# Patient Record
Sex: Female | Born: 1955 | Race: White | Hispanic: No | Smoking: Current every day smoker
Health system: Southern US, Community
[De-identification: ages and names within clinical notes are randomized; demographics above are authoritative.]

## PROBLEM LIST (undated history)

## (undated) DIAGNOSIS — M1990 Unspecified osteoarthritis, unspecified site: Secondary | ICD-10-CM

## (undated) DIAGNOSIS — K219 Gastro-esophageal reflux disease without esophagitis: Secondary | ICD-10-CM

## (undated) DIAGNOSIS — I499 Cardiac arrhythmia, unspecified: Secondary | ICD-10-CM

## (undated) DIAGNOSIS — I209 Angina pectoris, unspecified: Secondary | ICD-10-CM

## (undated) DIAGNOSIS — G8929 Other chronic pain: Secondary | ICD-10-CM

## (undated) DIAGNOSIS — E7800 Pure hypercholesterolemia, unspecified: Secondary | ICD-10-CM

## (undated) DIAGNOSIS — F419 Anxiety disorder, unspecified: Secondary | ICD-10-CM

## (undated) DIAGNOSIS — J449 Chronic obstructive pulmonary disease, unspecified: Secondary | ICD-10-CM

## (undated) DIAGNOSIS — R0600 Dyspnea, unspecified: Secondary | ICD-10-CM

## (undated) DIAGNOSIS — Z8719 Personal history of other diseases of the digestive system: Secondary | ICD-10-CM

## (undated) DIAGNOSIS — R51 Headache: Secondary | ICD-10-CM

## (undated) DIAGNOSIS — I10 Essential (primary) hypertension: Secondary | ICD-10-CM

## (undated) DIAGNOSIS — R112 Nausea with vomiting, unspecified: Secondary | ICD-10-CM

## (undated) DIAGNOSIS — Z9889 Other specified postprocedural states: Secondary | ICD-10-CM

## (undated) DIAGNOSIS — J42 Unspecified chronic bronchitis: Secondary | ICD-10-CM

## (undated) DIAGNOSIS — M797 Fibromyalgia: Secondary | ICD-10-CM

## (undated) DIAGNOSIS — R519 Headache, unspecified: Secondary | ICD-10-CM

## (undated) DIAGNOSIS — M549 Dorsalgia, unspecified: Secondary | ICD-10-CM

## (undated) DIAGNOSIS — R0609 Other forms of dyspnea: Secondary | ICD-10-CM

## (undated) DIAGNOSIS — G43909 Migraine, unspecified, not intractable, without status migrainosus: Secondary | ICD-10-CM

## (undated) HISTORY — PX: BACK SURGERY: SHX140

## (undated) HISTORY — PX: CHOLECYSTECTOMY: SHX55

## (undated) HISTORY — PX: POSTERIOR FUSION LUMBAR SPINE: SUR632

## (undated) HISTORY — PX: CARPAL TUNNEL RELEASE: SHX101

---
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
## 1977-03-22 HISTORY — PX: TUBAL LIGATION: SHX77

## 1980-03-22 HISTORY — PX: ABDOMINAL HYSTERECTOMY: SHX81

## 1983-03-23 HISTORY — PX: SALPINGOOPHORECTOMY: SHX82

## 2001-03-22 HISTORY — PX: LUMBAR DISC SURGERY: SHX700

## 2002-10-22 ENCOUNTER — Observation Stay (HOSPITAL_COMMUNITY): Admission: RE | Admit: 2002-10-22 | Discharge: 2002-10-22 | Payer: Self-pay | Admitting: *Deleted

## 2002-10-22 ENCOUNTER — Encounter: Payer: Self-pay | Admitting: *Deleted

## 2004-01-16 ENCOUNTER — Encounter: Admission: RE | Admit: 2004-01-16 | Discharge: 2004-01-16 | Payer: Self-pay | Admitting: Neurosurgery

## 2004-01-24 ENCOUNTER — Encounter: Admission: RE | Admit: 2004-01-24 | Discharge: 2004-01-24 | Payer: Self-pay | Admitting: Neurosurgery

## 2004-02-07 ENCOUNTER — Encounter: Admission: RE | Admit: 2004-02-07 | Discharge: 2004-02-07 | Payer: Self-pay | Admitting: Neurosurgery

## 2004-03-06 ENCOUNTER — Inpatient Hospital Stay (HOSPITAL_COMMUNITY): Admission: RE | Admit: 2004-03-06 | Discharge: 2004-03-08 | Payer: Self-pay | Admitting: Neurosurgery

## 2004-03-22 HISTORY — PX: ANTERIOR CERVICAL DISCECTOMY: SHX1160

## 2004-04-20 ENCOUNTER — Encounter: Admission: RE | Admit: 2004-04-20 | Discharge: 2004-04-20 | Payer: Self-pay | Admitting: Neurosurgery

## 2004-05-27 ENCOUNTER — Encounter: Admission: RE | Admit: 2004-05-27 | Discharge: 2004-05-27 | Payer: Self-pay | Admitting: Neurosurgery

## 2005-07-09 IMAGING — CR DG LUMBAR SPINE 2-3V
3 series · 3 of 3 positions shown · non-contrast
Comparison: none

CLINICAL DATA: 49 year old with lumbar fusion.
 THREE VIEW LUMBAR SPINE SERIES:
 Three views of the lumbar spine are compared to prior post operative films which are dated 03/05/04. 

 There are pedicle screws and posterior rods and interbody bone plug at L3-4.  No complicating features.  Normal overall alignment.

[view not recorded (1 of 3)]
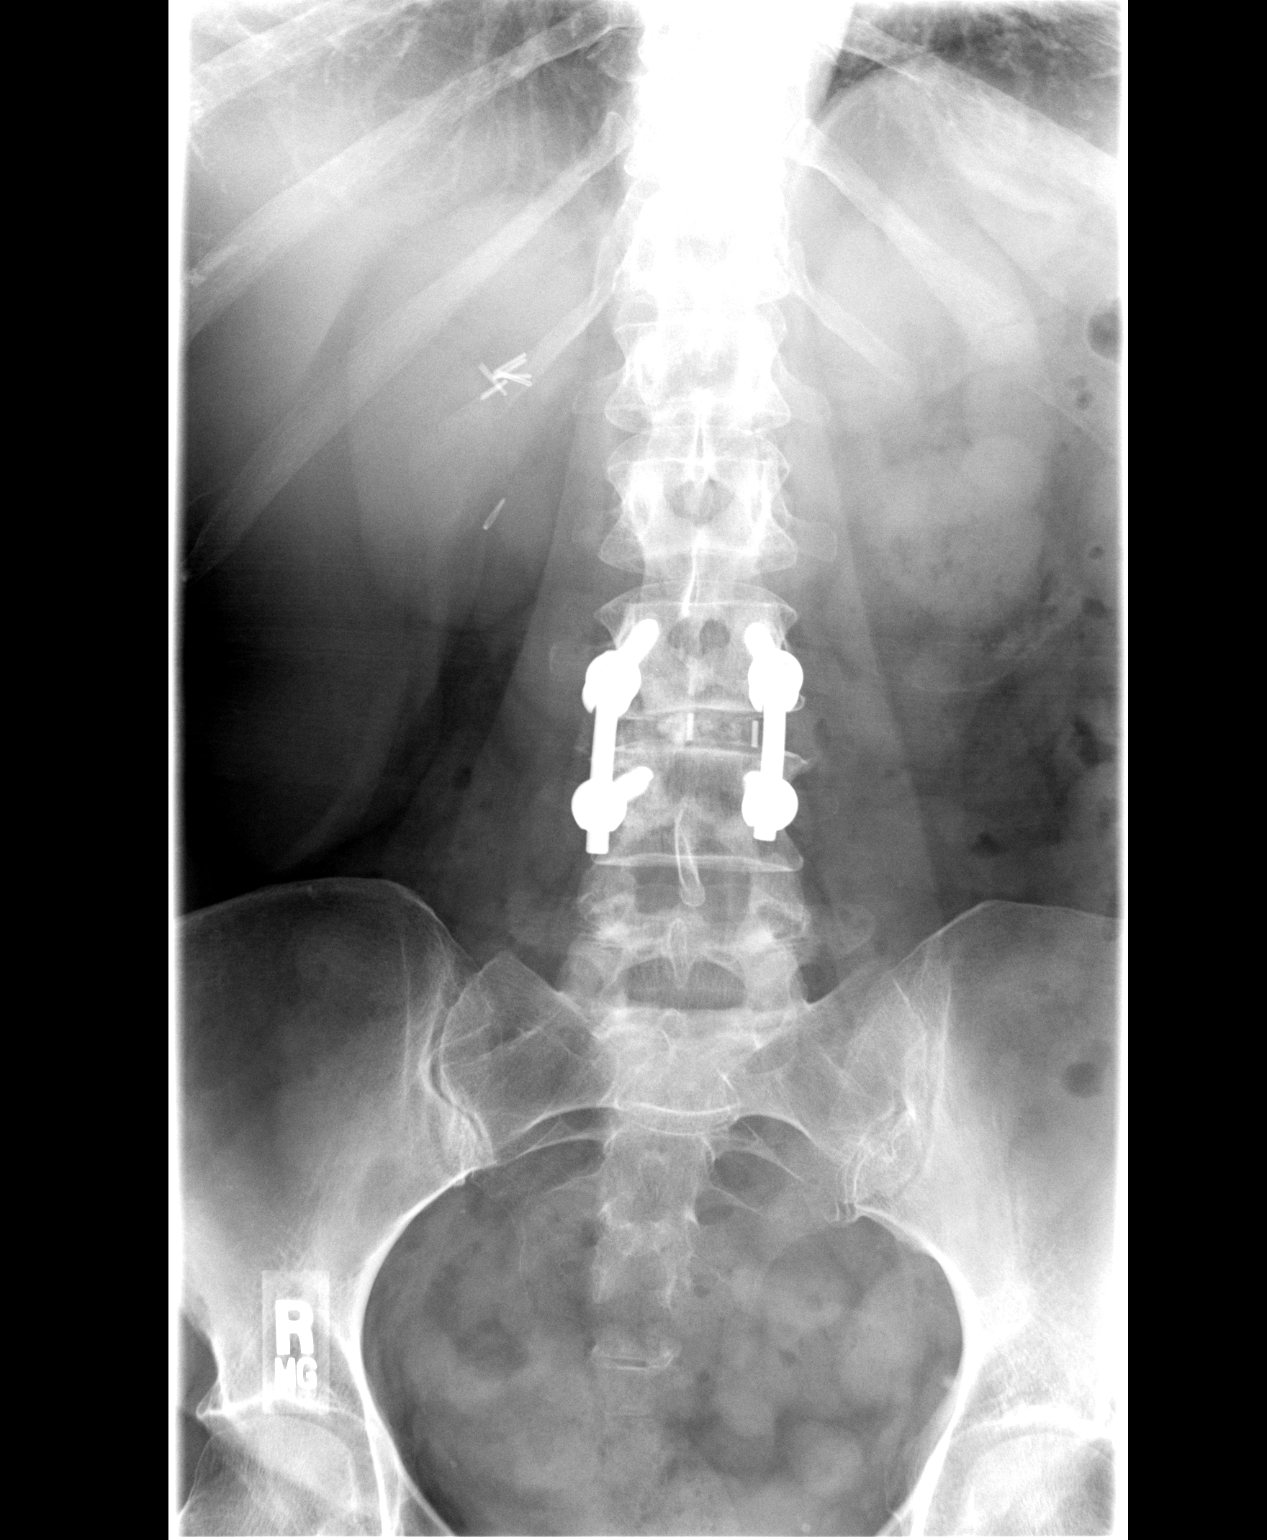

[view not recorded (2 of 3)]
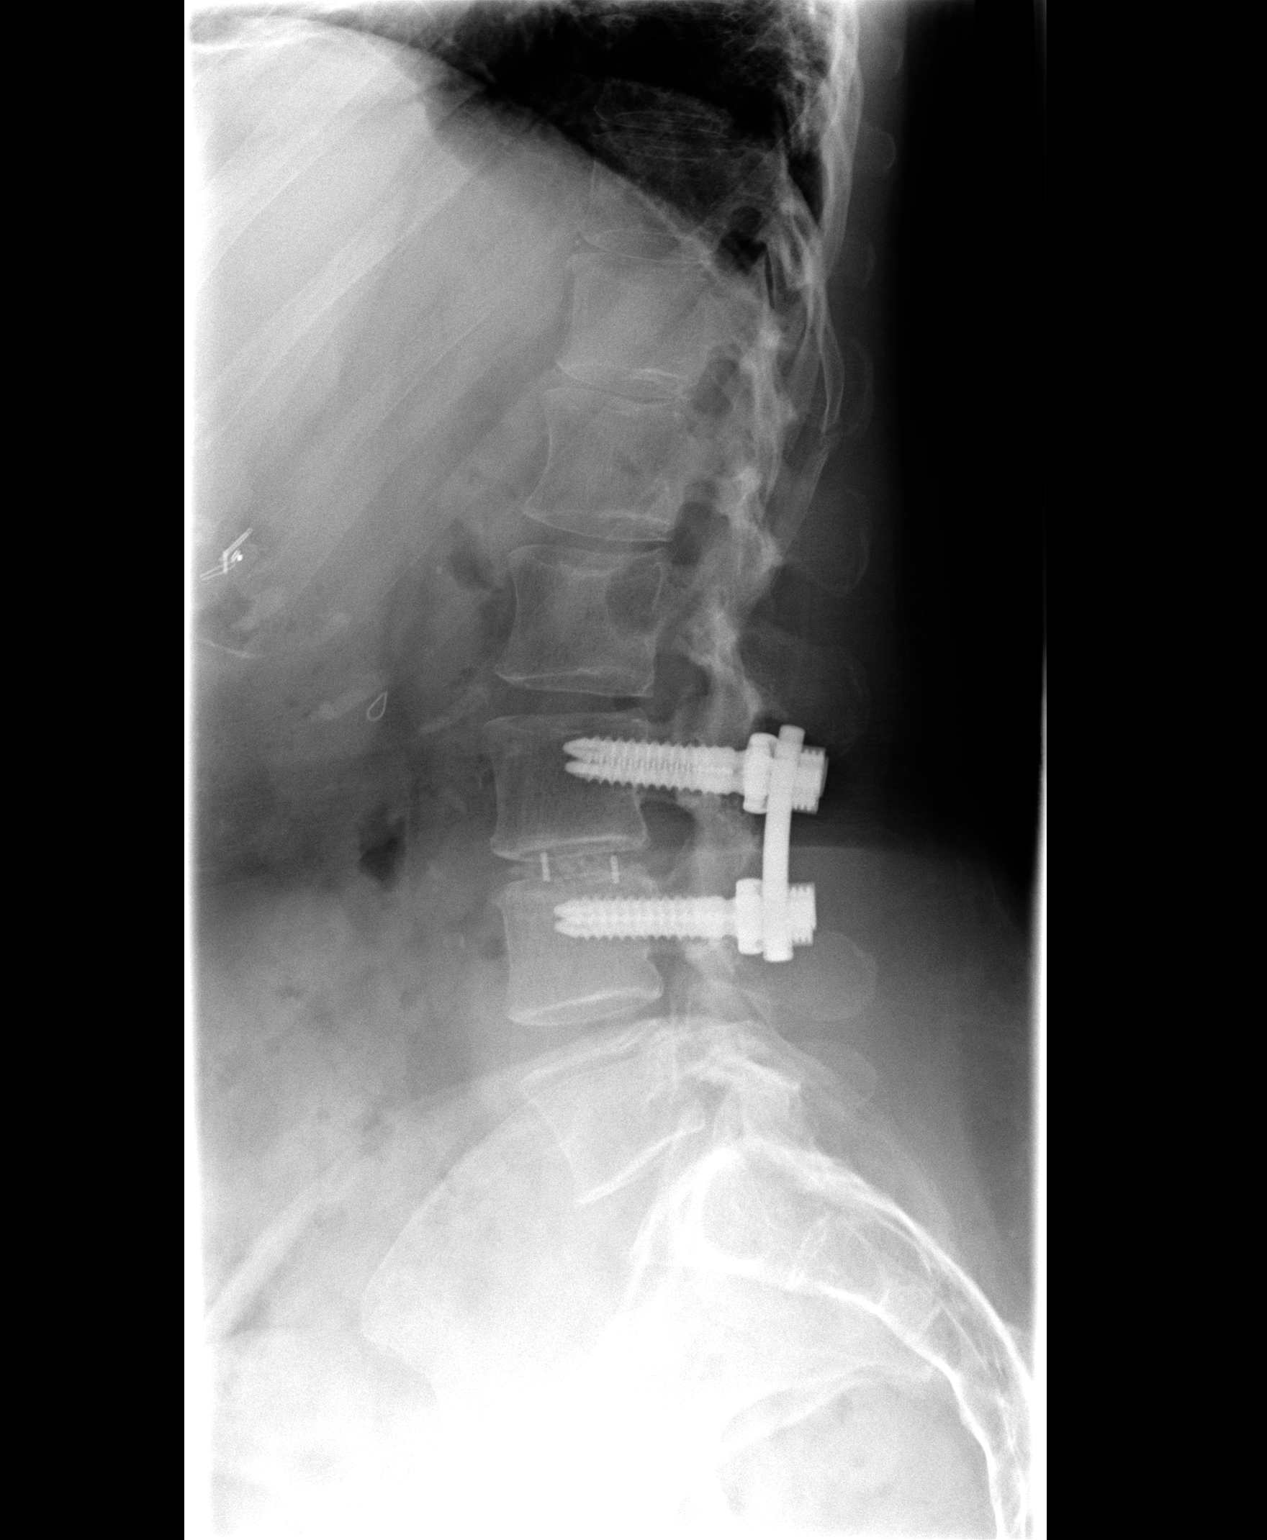

[view not recorded (3 of 3)]
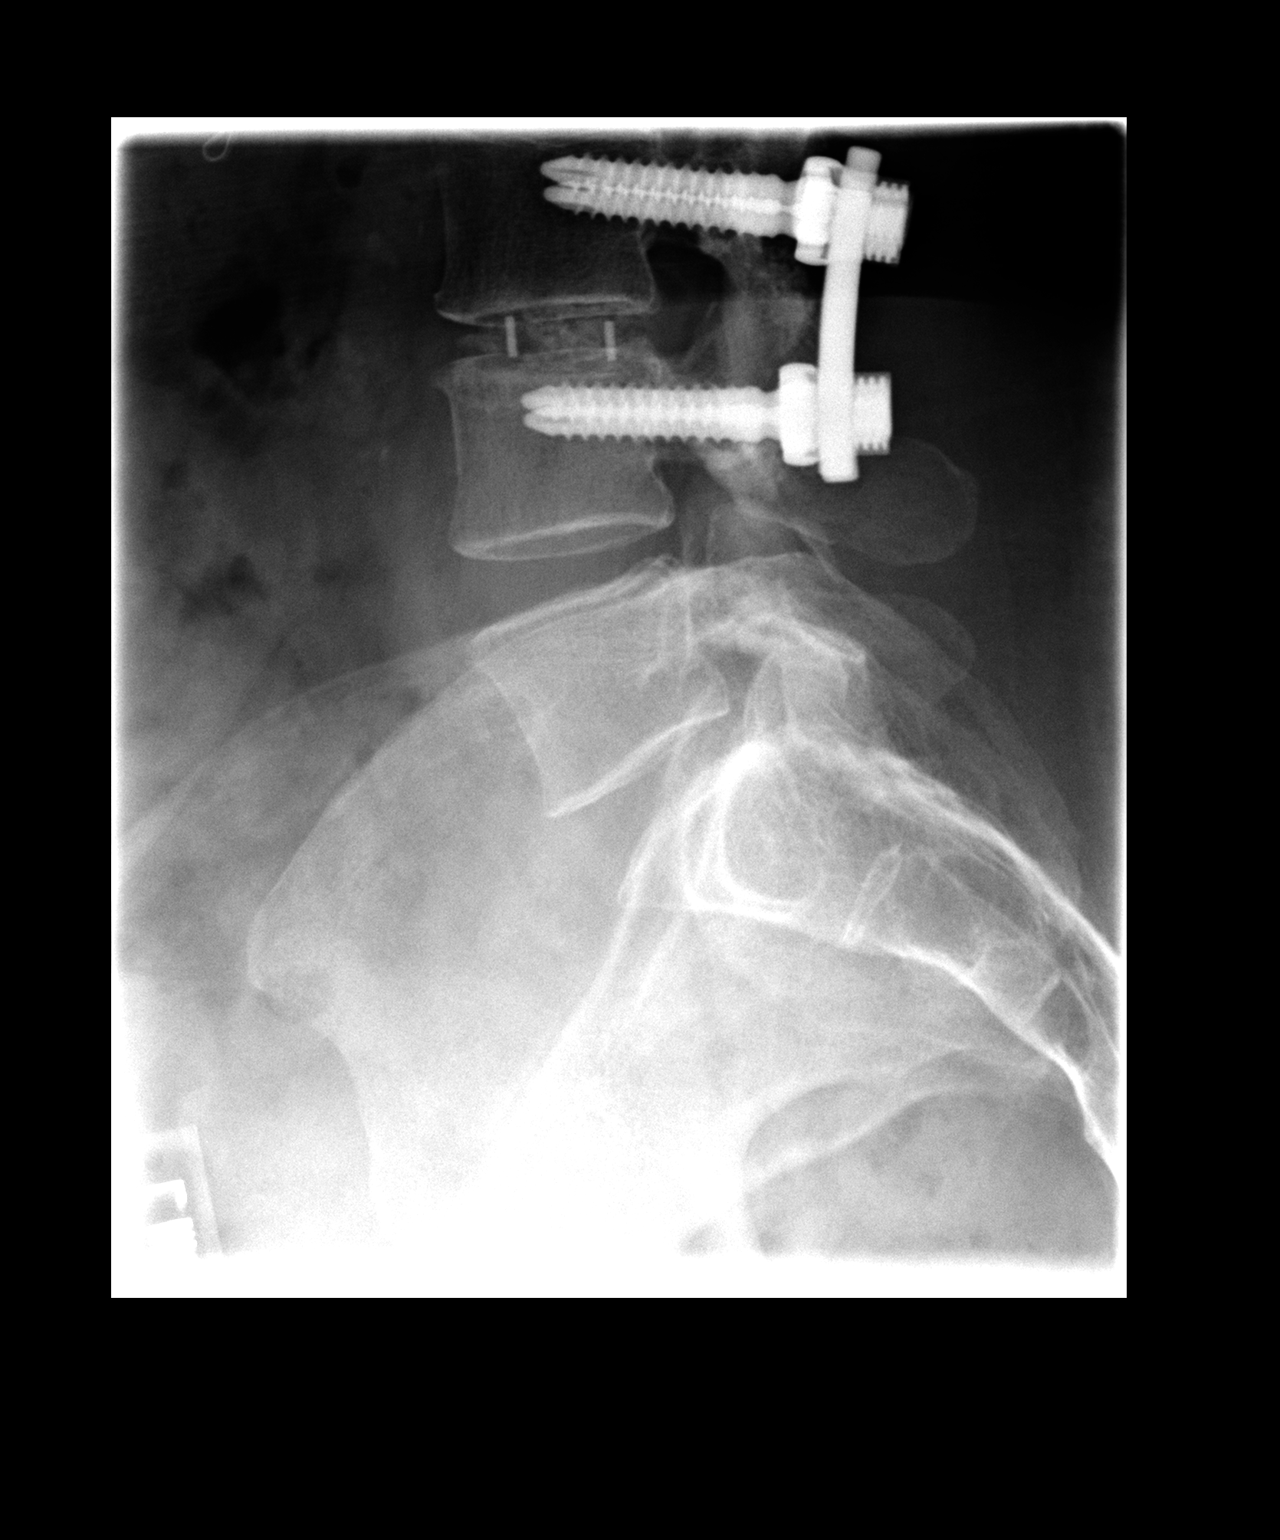

[3 of 3 positions shown; findings below may reference images not displayed]

IMPRESSION: 1.  L3-4 fusion without complicating features.
 2.  Healing or healed left eleventh rib fracture.

## 2007-04-21 ENCOUNTER — Inpatient Hospital Stay (HOSPITAL_COMMUNITY): Admission: RE | Admit: 2007-04-21 | Discharge: 2007-04-23 | Payer: Self-pay | Admitting: Neurosurgery

## 2009-11-11 ENCOUNTER — Encounter: Admission: RE | Admit: 2009-11-11 | Discharge: 2009-11-11 | Payer: Self-pay | Admitting: Neurosurgery

## 2009-12-16 ENCOUNTER — Inpatient Hospital Stay (HOSPITAL_COMMUNITY): Admission: RE | Admit: 2009-12-16 | Discharge: 2009-12-17 | Payer: Self-pay | Admitting: Neurosurgery

## 2010-01-26 ENCOUNTER — Encounter: Admission: RE | Admit: 2010-01-26 | Discharge: 2010-01-26 | Payer: Self-pay | Admitting: Neurosurgery

## 2010-03-09 ENCOUNTER — Encounter
Admission: RE | Admit: 2010-03-09 | Discharge: 2010-03-09 | Payer: Self-pay | Source: Home / Self Care | Attending: Neurosurgery | Admitting: Neurosurgery

## 2010-06-04 LAB — BASIC METABOLIC PANEL
BUN: 4 mg/dL — ABNORMAL LOW (ref 6–23)
CO2: 31 mEq/L (ref 19–32)
Calcium: 9.2 mg/dL (ref 8.4–10.5)
Chloride: 105 mEq/L (ref 96–112)
Creatinine, Ser: 0.73 mg/dL (ref 0.4–1.2)
GFR calc Af Amer: >60 mL/min (ref >60)
GFR calc non Af Amer: >60 mL/min (ref >60)
Glucose, Bld: 104 mg/dL — ABNORMAL HIGH (ref 70–99)
Potassium: 3.2 mEq/L — ABNORMAL LOW (ref 3.5–5.1)
Sodium: 144 mEq/L (ref 135–145)

## 2010-06-04 LAB — TYPE AND SCREEN
ABO/RH(D): A POS
Antibody Screen: NEGATIVE

## 2010-06-04 LAB — CBC
HCT: 39.7 % (ref 36.0–46.0)
MCV: 102.1 fL — ABNORMAL HIGH (ref 78.0–100.0)
RBC: 3.89 MIL/uL (ref 3.87–5.11)
RDW: 15 % (ref 11.5–15.5)
WBC: 6.1 10*3/uL (ref 4.0–10.5)

## 2010-06-04 LAB — SURGICAL PCR SCREEN: MRSA, PCR: NEGATIVE

## 2010-08-04 NOTE — Op Note (Signed)
NAMEVIRGIE, KUNDA               ACCOUNT NO.:  [id number]   MEDICAL RECORD NO.:  [id number]          PATIENT TYPE:  INP   LOCATION:  2864                         FACILITY:  [facility]   PHYSICIAN:  Reinaldo Meeker, M.D. 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   PROCEDURE:  Exploration of L3-4 fusion with removal of L3-4  instrumentation, followed by bilateral L4-5 decompressive laminectomy,  followed by L4-5 posterior lumbar interbody fusion with Nuvasive bony  spacer and PEEK interbody cage, followed by new pedicle screw  instrumentation and replacement of pedicle screw instrumentation L3-4/L4-  5, followed by L3-4 and L4-5 posterolateral fusion.   SURGEON:  Dr. Gerlene Fee.   ASSISTANT:  Dr. Marikay Alar.   PROCEDURE IN DETAIL:  After being placed in the prone position, the  patient's back was prepped and draped in usual sterile fashion.  Previous lumbar incision was opened and carried down a bit more inferior  until the spinous process of L3, L4, and L5 had been exposed.  The  instrumentation at L3-4 was identified bilaterally without difficulty,  and then subperiosteal section was then carried out on the spinous  processes and lamina of the facet joints of L4-5 and into the far  lateral region to identify the transverse processes of L3, L4 and L5.  The L4-5 segment was found to be extremely loose which was expected, but  the L3-4 level appeared to be solidly fused.  The top loading nuts at L3-  4 were removed and the rod removed bilaterally.  The spinous processes  of L4 and L5 were then removed, and the bone saved for use later in the  case.  Generous laminotomies were then performed bilaterally by  removing  the remainder of the L4 lamina, the medial three-quarters of the facet  joint, the superior half at the L5 lamina.  Residual bone was removed  and saved for use later in the case, and scar and ligamentum flavum  removed in a piecemeal fashion.  Similar decompression was then carried  on the right side, and the residual midline structures were removed to  complete the bilateral decompression.  At this time, bilateral  microdiskectomy was carried out at L4-5 without difficulty.  The disk  was then prepared for posterior interbody fusion.  This was distracted  up to a 10-mm size.  Rotating cutter followed by box chisel was used on  one side, and the 10-mm invasive bony spacer was placed.  On the  opposite side, a PEEK interbody cage was placed.  Prior to placing the  second spacer, autologous bone, BMP, and Actifuse fuse putty were placed  in the midline to help with the interbody fusion.  PEEK spacer was then  placed which was also filled with Actifuse and autologous bone.  Fluoroscopy showed these to be in good position.  Pedicle screws  were  then placed at L5 in standard fashion.  A drill hole was used for entry  followed by using the awl tapping with a 6-0 tap and placing a 6.5 x 14-  mm screws of InCompass bilaterally.  These were found in good position  with dry palpation of the pedicle under fluoroscopy.  At this time,  large amounts of irrigation were carried out.  Any bleeding controlled  with Bovie coagulation and Gelfoam.  Decortication was carried out in  the far lateral region, posterolateral fusion was carried out from L3-L5  with BMP, Actifuse, and autologous bone.  An appropriate length rod was  then chosen, and then the top loading nuts were secured sequentially  with sequential compression, particularly at L4-5.  Final fluoroscopy in  AP and lateral direction showed the interbody spacers, screws and rods  to all be in good position.  Any bleeding at this time was  controlled  with bipolar coagulation and Gelfoam.  A Hemovac drain was brought in  through a separate stab incision and left in the epidural space.  Wound  was then closed in multiple layers of Vicryl on the muscle fascia,  subcutaneous and subcutaneous tissues, and staples were placed on the  skin.  A sterile dressing was then applied, and the patient was  extubated and taken to the recovery room in stable condition.           ______________________________  Reinaldo Meeker, M.D.     ROK/MEDQ  D:  04/21/2007  T:  04/21/2007  Job:  [id number]

## 2010-08-07 NOTE — Op Note (Signed)
Mackenzie Butler, EVEN               ACCOUNT NO.:  [id number]   MEDICAL RECORD NO.:  [id number]          PATIENT TYPE:  INP   LOCATION:  2899                         FACILITY:  [facility]   PHYSICIAN:  Reinaldo Meeker, M.D. 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   PROCEDURES:  1.  Left L3-4 decompressive laminotomy with microdiskectomy, followed by      left transverse lumbar interbody fusion with PEEK cage and autologous      bone graft, followed by bilateral L4-5 decompressive laminotomies for      spinal stenosis.  2.  Microdissection, L3 and L4 nerve roots on the left and L5 nerve root on      the bilateral.   PROCEDURE IN DETAIL:  After being placed in the prone position, the  patient's back was prepped and draped in the usual sterile fashion.  Localizing fluoroscopy was used prior to incision to identify the  appropriate level.  A midline incision was made above the spinous process of  L2, L3, L4, and L5.  Using the Bovie cutting current, the incision was  carried down to the spinous processes.  Subperiosteal dissection was then  carried out bilaterally on the spinous processes and lamina and facet joint  of L3, L4, and L5 bilaterally.  A self-retaining retractor was placed for  exposure and inferior edge of L2 was then identified bilaterally.  X-ray  showed approach to the appropriate levels.  Starting at L3-4 on the left,  edges of the previous laminotomy were identified.  A high-speed drill was  used to widen the laminotomy until a generous decompression had been carried  out.  L3 and L4 nerve roots were identified and tracked out their foramen.  L3-4 disk was found to be markedly herniated.  The  annulus was coagulated,  incised with a 15 blade.  Using pituitary rongeurs and curettes, thorough  disk space clean-out was carried out.  At the same time great care was taken  to avoid injury to the neural elements, and this was successfully done.  Inspection was carried out and the nerve root was found to be well-  decompressed.  Attention was turned to L4-5, where bilateral laminotomies  were performed.  The inferior one-third of the L4 lamina and the medial  third of the facet joint and the superior one-third of the L5 lamina were  removed bilaterally.  Residual bone and ligamentum flavum were removed in a  piecemeal fashion.  L5 nerve roots were tracked out their foramen without  difficulty.  This was inspected and found not to be herniated.  The  decompression was complete at this level.  A small amount of residual  ligament in the midline was removed to complete the medial decompression,  but the spinous process, intraspinous ligament were not disturbed.  Attention was  turned now to the L3-4 level, where transverse lumbar  interbody fusion was performed on the left.  The disk space was prepared  with a variety of instruments.  The 10 mm cage was filled with autologous  bone.  Prior to placing the cage, autologous bone graft was placed deep  within the interspace to aid with the interbody fusion.  The cage was then  impacted without difficulty and fluoroscopy showed it to be in good position  in the AP and lateral positions.  Dr. Yetta Barre then assumed control of the case  and did pedicle screws bilaterally, followed by posterolateral fusion, which  will be dictated under separate dictation.  Gelfoam was then  placed over the dural exposures and the wound closed in multiple layers of  Vicryl on the muscle, fascia, subcutaneous and subcuticular tissues, and  staples were placed on the skin.  A sterile dressing was then applied and  the patient was extubated and taken to the recovery room  in stable  condition.       ROK/MEDQ  D:  03/06/2004  T:  03/07/2004  Job:  [id number]

## 2010-08-07 NOTE — Op Note (Signed)
NAME:  Mackenzie Butler, Mackenzie Butler                         ACCOUNT NO.:  [id number]   MEDICAL RECORD NO.:  [id number]                   PATIENT TYPE:  INP   LOCATION:  3172                                 FACILITY:  [facility]   PHYSICIAN:  Ricky D. Gasper Sells, M.D.             DATE OF BIRTH:  06/26/1955   DATE OF PROCEDURE:  10/22/2002  DATE OF DISCHARGE:                                 OPERATIVE REPORT   OPERATION:  Left L3-4 microsurgical diskectomy.   SURGEON:  Ricky D. Gasper Sells, M.D.   ASSISTANT:  Izell Logan [place]. Elesa Hacker, M.D.   ANESTHESIA:  General controlled.   COMPLICATIONS:  None.   INDICATIONS FOR PROCEDURE:  This is a 55 year old female with a long history  of well documented low back pain, acutely worse over the last several months  with left sided leg pain and a clear cut L3-4 HNP on her CT scan concordant  with her clinical findings, that is weakness in the quadriceps, numbness and  tingling  in the anterior mesial aspect of the thigh and positive absent  knee jerk on the left with a positive femoral nerve stretch.   FINDINGS:  Interoperatively a clearly ruptured L3-4 disk  was identified. It  was incised and removed. Two interoperative control x-rays were used to  confirm the level. No complications occurred.   DESCRIPTION OF PROCEDURE:  With the patient in the prone position the  patient was prepped and draped and positioned and all pressure points were  carefully  padded in the usual fashion for a lumbar laminectomy. Prior to  cutting the skin, the patient's left side was marked with a magic marker and  we reviewed the case to make sure that this was the appropriate patient, the  appropriate side and the appropriate level. All of it checked out.   A midline incision was made and a convenient spinous process was chosen and  an Allis instrument was clamped onto it and this proved to be L3. The level  immediately below on the left side was then exposed, dissecting the  ligaments and  muscles off the spinous process and lamina of L3 and L4 on the  left side.   An inferior L3 laminotomy was performed on the left with the high-speed  drill and Kerrison punch as well as a medial facetectomy of the L3-4. The  underlying ligament of Flavum was excised and removed and the dural sac was  retracted  medially to reveal some distended vessels. These were coagulated  with bipolar coagulation and then the epidural veins were packed superiorly  and inferiorly with a total of 2 cotton pledgets. A #4 Nicholos Johns was left in  place and an interoperative control x-ray was used to confirm the  appropriate level  was being  operated on.   Then when that was confirmed the microscope was brought into place and the  annulus was incised with a #15 blade knife  and the disk  was evacuated with  straight biting, up biting and down biting pituitary rongeurs. The cartilage  endplate was scraped with a medium bent ring curet and the subligamentous  space was dissected with an Epstein instrument and then evacuated in a  similar fashion.   Two pieces of Surgicel with vancomycin were then placed in the interspace  through the annulotomy and the L4 foramen was palpated and found  to be  clear. Epidural hemostasis was achieved with Surgicel once the cotton  pledgets had been removed. The self-retaining retractor was removed.   The dorsolumbar fascia was closed with 3 interrupted 2-0 Vicryl sutures.  Then 5 mL of vancomycin solution was left in the subfascial space.  Subcutaneous closure was achieved with 2-0 Vicryl inverted interrupted and  skin staples were used in the skin. When the last staple was ready to be put  in, vancomycin was left in the subcutaneous space and 1 staple was then used  to seal the skin. A total of 8 mL of 0.5% bupivacaine was used to  anesthetize the skin edges. Betadine ointment and a dry dressing  were  applied.   All sponge, instrument and needle counts were correct. The  patient tolerated  the procedure well.                                               Ricky D. Gasper Sells, M.D.    RDH/MEDQ  D:  10/22/2002  T:  10/22/2002  Job:  [id number]   cc:   Dr. Everlean Alstrom [facility]

## 2010-08-07 NOTE — Op Note (Signed)
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   PROCEDURE:  1.  Posterolateral lumbar arthrodesis L3-L4 utilizing locally harvested      morselized autologous bone graft.  2.  Nonsegmental fixation L3-L4 utilizing the Nuvasive pedicle screw system.   SURGEON:  Tia Alert, MD   ASSISTANT:  Reinaldo Meeker, M.D.   ANESTHESIA:  General endotracheal anesthesia.   COMPLICATIONS:  None apparent.   INDICATIONS FOR PROCEDURE:  Ms. Hilgers is a 55 year old female who was  undergoing a transforaminal lumbar interbody fusion by Dr. Gerlene Fee with,  also, decompression at L4-L5.  He asked me to perform the posterolateral  fusion and nonsegmental instrumentation for him and, therefore, after the  transforaminal lumbar and interbody fusion, I entered the case to perform  placement of the hardware and the posterolateral fusion.   DESCRIPTION OF PROCEDURE:  The patient had undergone a transforaminal lumbar  interbody fusion by Dr. Gerlene Fee at L3-L4.  I carried the dissection out to  find the transverse processes of L3 and L4 and then localized the pedicle  screw entry zones.  I then probed each pedicle under fluoroscopic guidance,  tapped each pedicle with a 5/5 tap, and then placed 6.5 by 40 mm pedicle  screws into the pedicles of L3 and L4 bilaterally.  These were then checked  with fluoroscopy.  We then placed two lordotic rods into the multiaxial  screw heads of the pedicle screws and locked these into position with  the  locking caps and anti-torque device after achieving compression of the  interbody graft.  We then copiously irrigated with Bacitracin containing  saline solution and then drilled the posterior elements of L3 and L4 on the  patient's right side and placed locally harvested morselized autologous bone  graft out over these for posterolateral fusion.  I then turned the case back  over to Dr. Gerlene Fee for the remainder of the procedure.      Markus.Osmond   DSJ/MEDQ  D:  03/06/2004  T:  03/06/2004  Job:  [id number]

## 2010-12-10 LAB — APTT: aPTT: 31

## 2010-12-10 LAB — BASIC METABOLIC PANEL
CO2: 23
Calcium: 9
Creatinine, Ser: 0.81
GFR calc Af Amer: >60
GFR calc non Af Amer: >60
Glucose, Bld: 105 — ABNORMAL HIGH
Sodium: 136

## 2010-12-10 LAB — CBC
MCHC: 34.1
RDW: 14.4

## 2010-12-10 LAB — PROTIME-INR
INR: 0.9
Prothrombin Time: 12.5

## 2011-02-28 IMAGING — CR DG CHEST 2V
2 series · 2 of 2 positions shown · non-contrast
Comparison: 04/17/2007

CLINICAL DATA: Preop.

CHEST - 2 VIEW

[view not recorded (1 of 2)]
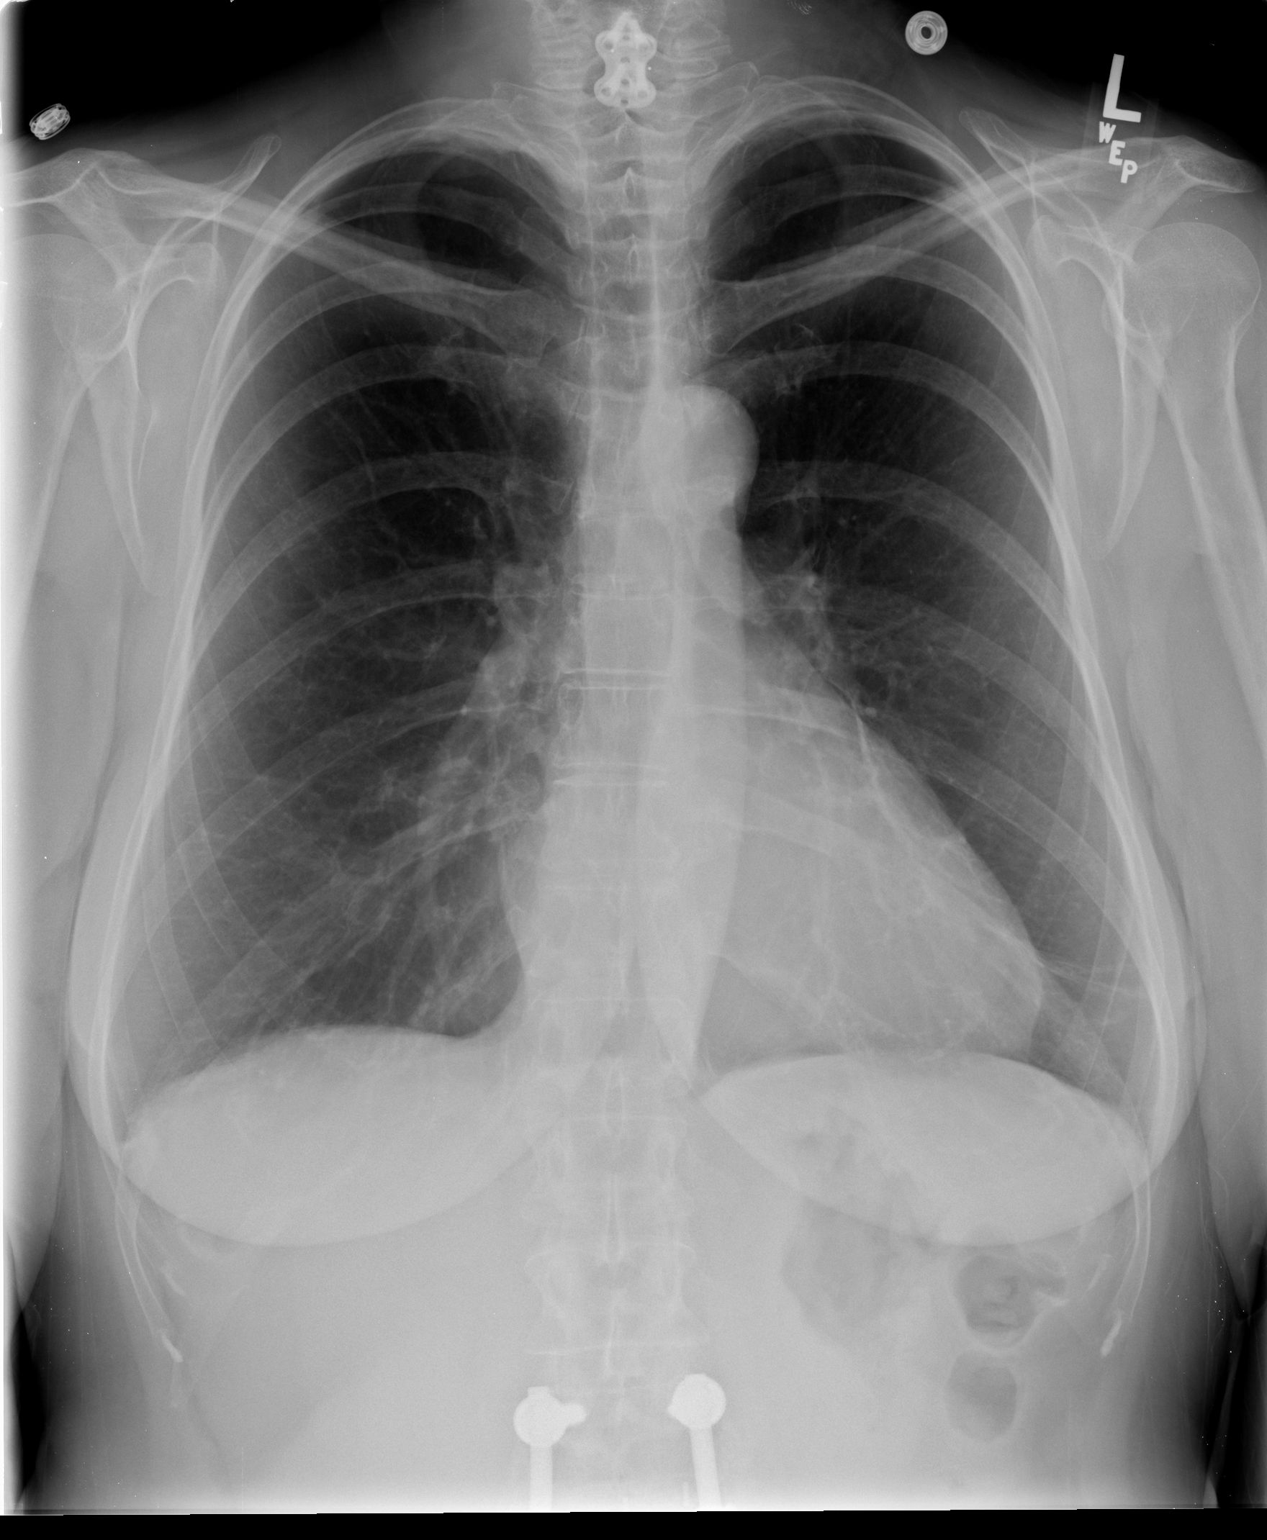

[view not recorded (2 of 2)]
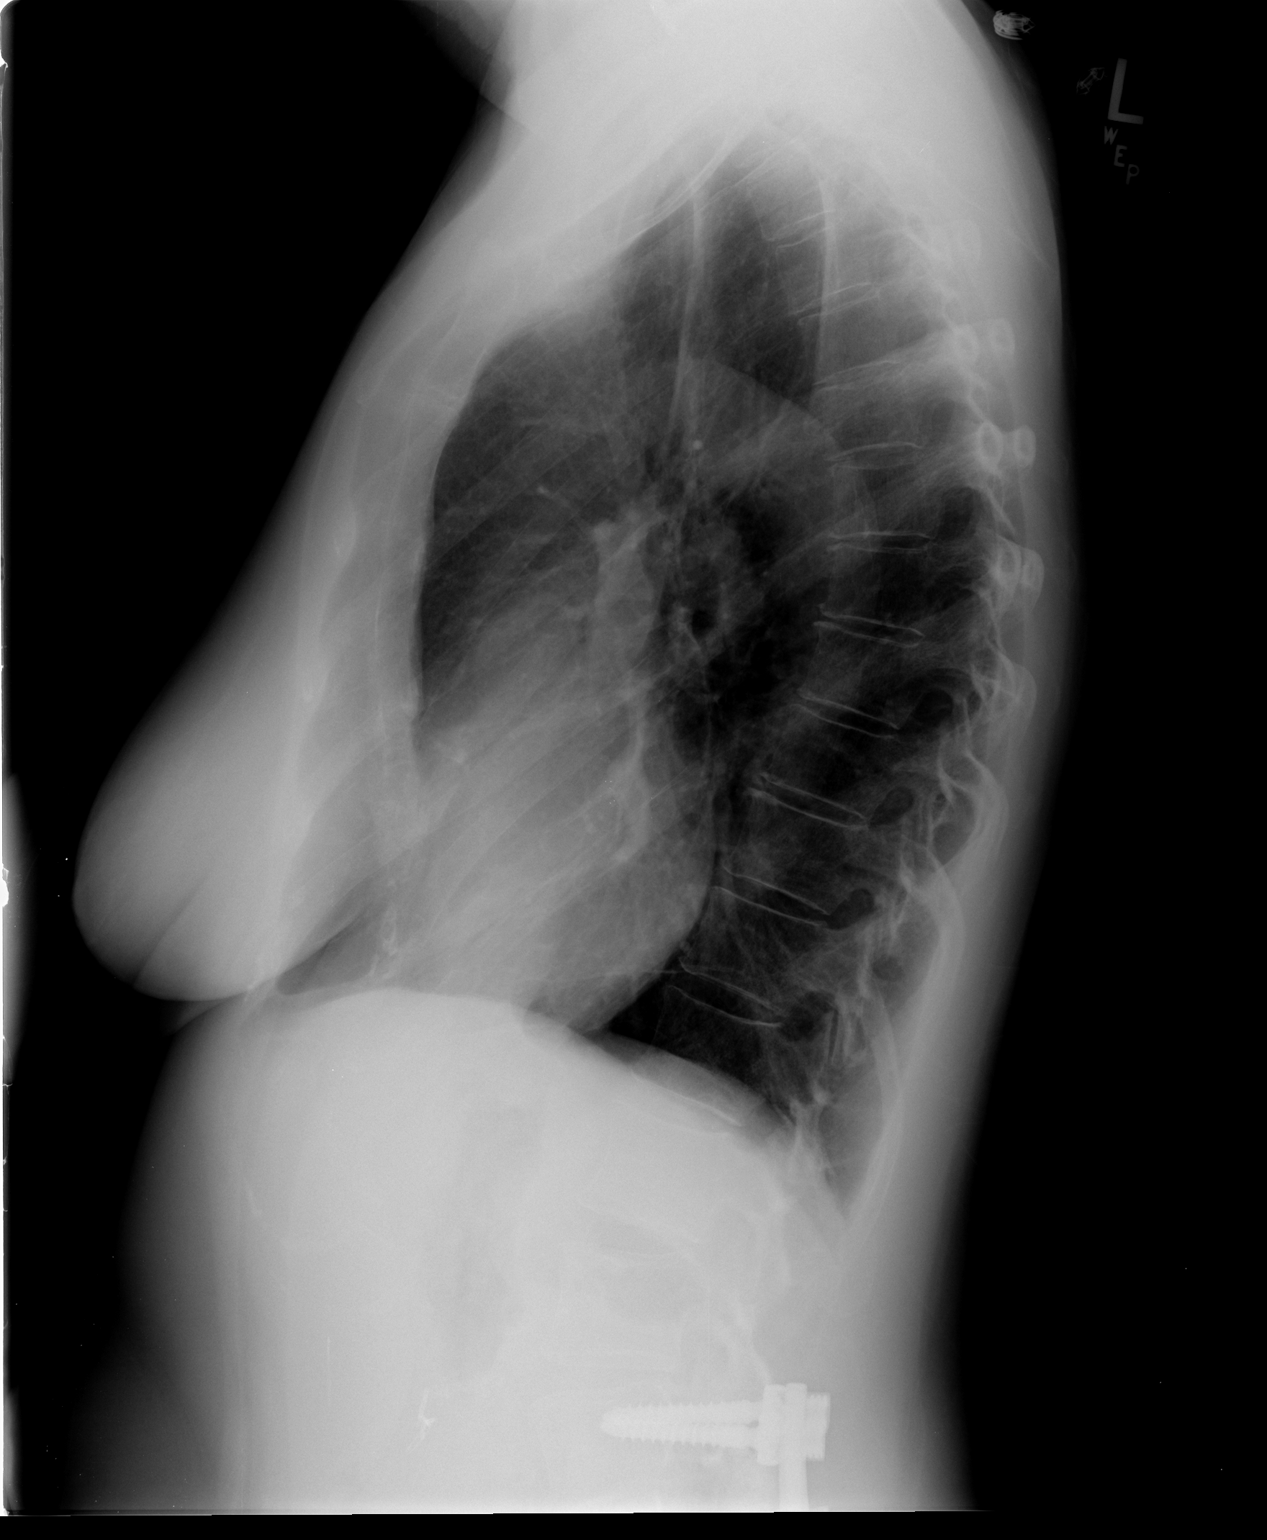

[2 of 2 positions shown; findings below may reference images not displayed]

FINDINGS: Trachea is midline.  Heart size normal.  Scarring the
lingula and likely within the right lung base.  Biapical pleural
thickening.  Lungs are otherwise clear.  No pleural fluid.
Prominent pectus deformity.
IMPRESSION: Probable scattered bibasilar scarring.  No acute findings.

## 2012-01-26 ENCOUNTER — Other Ambulatory Visit: Payer: Self-pay | Admitting: Neurosurgery

## 2012-01-26 DIAGNOSIS — M48061 Spinal stenosis, lumbar region without neurogenic claudication: Secondary | ICD-10-CM

## 2012-01-26 DIAGNOSIS — M792 Neuralgia and neuritis, unspecified: Secondary | ICD-10-CM

## 2012-01-26 DIAGNOSIS — M545 Low back pain: Secondary | ICD-10-CM

## 2012-01-31 ENCOUNTER — Ambulatory Visit
Admission: RE | Admit: 2012-01-31 | Discharge: 2012-01-31 | Disposition: A | Discharge status: Home / Self Care | Payer: Medicare Other | Source: Ambulatory Visit | Attending: Neurosurgery | Admitting: Neurosurgery

## 2012-01-31 VITALS — BP 91/52 | HR 58 | Ht 61.0 in | Wt 134.0 lb

## 2012-01-31 DIAGNOSIS — M48061 Spinal stenosis, lumbar region without neurogenic claudication: Secondary | ICD-10-CM

## 2012-01-31 DIAGNOSIS — M545 Low back pain: Secondary | ICD-10-CM

## 2012-01-31 DIAGNOSIS — M792 Neuralgia and neuritis, unspecified: Secondary | ICD-10-CM

## 2012-01-31 MED ORDER — IOHEXOL 180 MG/ML  SOLN
15.0000 mL | Freq: Once | INTRAMUSCULAR | Status: AC | PRN
  Administered 2012-01-31: 15 mL via INTRATHECAL

## 2012-01-31 MED ORDER — OXYCODONE-ACETAMINOPHEN 5-325 MG PO TABS
2.0000 | ORAL_TABLET | Freq: Once | ORAL | Status: AC
  Administered 2012-01-31: 2 via ORAL

## 2012-01-31 MED ORDER — MEPERIDINE HCL 100 MG/ML IJ SOLN
75.0000 mg | Freq: Once | INTRAMUSCULAR | Status: AC
  Administered 2012-01-31: 75 mg via INTRAMUSCULAR

## 2012-01-31 MED ORDER — ONDANSETRON HCL 4 MG/2ML IJ SOLN
4.0000 mg | Freq: Once | INTRAMUSCULAR | Status: AC
  Administered 2012-01-31: 4 mg via INTRAMUSCULAR

## 2012-01-31 MED ORDER — DIAZEPAM 5 MG PO TABS
10.0000 mg | ORAL_TABLET | Freq: Once | ORAL | Status: AC
  Administered 2012-01-31: 10 mg via ORAL

## 2012-01-31 NOTE — Progress Notes (Signed)
Pt states she has been off of cymbalta and phenergan for the past 2 days.

## 2012-02-03 ENCOUNTER — Other Ambulatory Visit: Payer: Self-pay

## 2012-03-01 ENCOUNTER — Other Ambulatory Visit: Payer: Self-pay | Admitting: Neurosurgery

## 2012-03-01 ENCOUNTER — Encounter (HOSPITAL_COMMUNITY): Payer: Self-pay | Admitting: Respiratory Therapy

## 2012-03-07 ENCOUNTER — Encounter (HOSPITAL_COMMUNITY): Payer: Self-pay

## 2012-03-07 ENCOUNTER — Encounter (HOSPITAL_COMMUNITY)
Admission: RE | Admit: 2012-03-07 | Discharge: 2012-03-07 | Disposition: A | Discharge status: Home / Self Care | Payer: Medicare Other | Source: Ambulatory Visit | Attending: Neurosurgery | Admitting: Neurosurgery

## 2012-03-07 HISTORY — DX: Personal history of other diseases of the digestive system: Z87.19

## 2012-03-07 HISTORY — DX: Essential (primary) hypertension: I10

## 2012-03-07 HISTORY — DX: Cardiac arrhythmia, unspecified: I49.9

## 2012-03-07 HISTORY — DX: Nausea with vomiting, unspecified: R11.2

## 2012-03-07 HISTORY — DX: Gastro-esophageal reflux disease without esophagitis: K21.9

## 2012-03-07 HISTORY — DX: Fibromyalgia: M79.7

## 2012-03-07 HISTORY — DX: Other specified postprocedural states: Z98.890

## 2012-03-07 HISTORY — DX: Unspecified osteoarthritis, unspecified site: M19.90

## 2012-03-07 LAB — SURGICAL PCR SCREEN
MRSA, PCR: NEGATIVE
Staphylococcus aureus: POSITIVE — AB

## 2012-03-07 LAB — BASIC METABOLIC PANEL
BUN: 15 mg/dL (ref 6–23)
CO2: 27 mEq/L (ref 19–32)
Chloride: 101 mEq/L (ref 96–112)
GFR calc non Af Amer: 57 mL/min — ABNORMAL LOW (ref >90)
Glucose, Bld: 92 mg/dL (ref 70–99)
Potassium: 4.3 mEq/L (ref 3.5–5.1)
Sodium: 138 mEq/L (ref 135–145)

## 2012-03-07 LAB — CBC
HCT: 39.6 % (ref 36.0–46.0)
Hemoglobin: 12.6 g/dL (ref 12.0–15.0)
MCH: 31 pg (ref 26.0–34.0)
MCHC: 31.8 g/dL (ref 30.0–36.0)
MCV: 97.3 fL (ref 78.0–100.0)
RBC: 4.07 MIL/uL (ref 3.87–5.11)

## 2012-03-07 NOTE — Pre-Procedure Instructions (Signed)
20 Mackenzie Butler  03/07/2012   Your procedure is scheduled on: Thursday  03/09/12   Report to [facility] at 820 AM.  Call this number if you have problems the morning of surgery: [phone]   Remember:   Do not eat food OR DRINK :After Midnight  Take these medicines the morning of surgery with A SIP OF WATER: ALBUTEROL, XANAX, SYMBICORT, CYMBALTA, NEXIUM, ESTRACE, NADOLOL(CORGARD), OXYCODONE    Do not wear jewelry, make-up or nail polish.  Do not wear lotions, powders, or perfumes. You may wear deodorant.  Do not shave 48 hours prior to surgery. Men may shave face and neck.  Do not bring valuables to the hospital.  Contacts, dentures or bridgework may not be worn into surgery.  Leave suitcase in the car. After surgery it may be brought to your room.  For patients admitted to the hospital, checkout time is 11:00 AM the day of discharge.   Patients discharged the day of surgery will not be allowed to drive home.  Name and phone number of your driver:   Special Instructions: Shower using CHG 2 nights before surgery and the night before surgery.  If you shower the day of surgery use CHG.  Use special wash - you have one bottle of CHG for all showers.  You should use approximately 1/3 of the bottle for each shower.   Please read over the following fact sheets that you were given: Pain Booklet, Coughing and Deep Breathing, Blood Transfusion Information, MRSA Information and Surgical Site Infection Prevention

## 2012-03-08 MED ORDER — CEFAZOLIN SODIUM-DEXTROSE 2-3 GM-% IV SOLR
2.0000 g | INTRAVENOUS | Status: AC
  Administered 2012-03-09: 2 g via INTRAVENOUS
  Filled 2012-03-08: qty 50

## 2012-03-08 MED ORDER — DEXAMETHASONE SODIUM PHOSPHATE 10 MG/ML IJ SOLN
10.0000 mg | INTRAMUSCULAR | Status: DC
  Filled 2012-03-08: qty 1

## 2012-03-09 ENCOUNTER — Encounter (HOSPITAL_COMMUNITY): Payer: Self-pay | Admitting: *Deleted

## 2012-03-09 ENCOUNTER — Inpatient Hospital Stay (HOSPITAL_COMMUNITY)
Admission: RE | Admit: 2012-03-09 | Discharge: 2012-03-11 | DRG: 460 | Disposition: A | Discharge status: Home / Self Care | Payer: Medicare Other | Source: Ambulatory Visit | Attending: Neurosurgery | Admitting: Neurosurgery

## 2012-03-09 ENCOUNTER — Encounter (HOSPITAL_COMMUNITY): Payer: Self-pay | Admitting: Certified Registered"

## 2012-03-09 ENCOUNTER — Encounter (HOSPITAL_COMMUNITY)
Admission: RE | Disposition: A | Discharge status: Home / Self Care | Payer: Self-pay | Source: Ambulatory Visit | Attending: Neurosurgery

## 2012-03-09 ENCOUNTER — Inpatient Hospital Stay (HOSPITAL_COMMUNITY): Payer: Medicare Other | Admitting: Certified Registered"

## 2012-03-09 ENCOUNTER — Inpatient Hospital Stay (HOSPITAL_COMMUNITY): Payer: Medicare Other

## 2012-03-09 ENCOUNTER — Encounter (HOSPITAL_COMMUNITY): Payer: Self-pay | Admitting: General Practice

## 2012-03-09 DIAGNOSIS — T84498A Other mechanical complication of other internal orthopedic devices, implants and grafts, initial encounter: Principal | ICD-10-CM | POA: Diagnosis present

## 2012-03-09 DIAGNOSIS — M129 Arthropathy, unspecified: Secondary | ICD-10-CM | POA: Diagnosis present

## 2012-03-09 DIAGNOSIS — K219 Gastro-esophageal reflux disease without esophagitis: Secondary | ICD-10-CM | POA: Diagnosis present

## 2012-03-09 DIAGNOSIS — IMO0001 Reserved for inherently not codable concepts without codable children: Secondary | ICD-10-CM | POA: Diagnosis present

## 2012-03-09 DIAGNOSIS — F17200 Nicotine dependence, unspecified, uncomplicated: Secondary | ICD-10-CM | POA: Diagnosis present

## 2012-03-09 DIAGNOSIS — I10 Essential (primary) hypertension: Secondary | ICD-10-CM | POA: Diagnosis present

## 2012-03-09 DIAGNOSIS — Y831 Surgical operation with implant of artificial internal device as the cause of abnormal reaction of the patient, or of later complication, without mention of misadventure at the time of the procedure: Secondary | ICD-10-CM | POA: Diagnosis present

## 2012-03-09 HISTORY — DX: Dyspnea, unspecified: R06.00

## 2012-03-09 HISTORY — DX: Dorsalgia, unspecified: M54.9

## 2012-03-09 HISTORY — DX: Other forms of dyspnea: R06.09

## 2012-03-09 HISTORY — DX: Chronic obstructive pulmonary disease, unspecified: J44.9

## 2012-03-09 HISTORY — DX: Headache: R51

## 2012-03-09 HISTORY — DX: Unspecified chronic bronchitis: J42

## 2012-03-09 HISTORY — PX: POSTERIOR LUMBAR FUSION 4 WITH HARDWARE REMOVAL: SHX6038

## 2012-03-09 HISTORY — DX: Headache, unspecified: R51.9

## 2012-03-09 HISTORY — DX: Pure hypercholesterolemia, unspecified: E78.00

## 2012-03-09 HISTORY — DX: Other chronic pain: G89.29

## 2012-03-09 HISTORY — DX: Angina pectoris, unspecified: I20.9

## 2012-03-09 HISTORY — DX: Migraine, unspecified, not intractable, without status migrainosus: G43.909

## 2012-03-09 HISTORY — DX: Anxiety disorder, unspecified: F41.9

## 2012-03-09 SURGERY — POSTERIOR LUMBAR FUSION 4 WITH HARDWARE REMOVAL
Anesthesia: General | Site: Back | Wound class: Clean

## 2012-03-09 MED ORDER — LIDOCAINE HCL (CARDIAC) 20 MG/ML IV SOLN
INTRAVENOUS | Status: DC | PRN
  Administered 2012-03-09: 50 mg via INTRAVENOUS

## 2012-03-09 MED ORDER — MIDAZOLAM HCL 5 MG/5ML IJ SOLN
INTRAMUSCULAR | Status: DC | PRN
  Administered 2012-03-09: 1 mg via INTRAVENOUS

## 2012-03-09 MED ORDER — CYCLOBENZAPRINE HCL 10 MG PO TABS
ORAL_TABLET | ORAL | Status: AC
  Filled 2012-03-09: qty 1

## 2012-03-09 MED ORDER — CEFAZOLIN SODIUM-DEXTROSE 2-3 GM-% IV SOLR
2.0000 g | Freq: Three times a day (TID) | INTRAVENOUS | Status: AC
Start: 2012-03-09 — End: 2012-03-10
  Administered 2012-03-09 – 2012-03-10 (×2): 2 g via INTRAVENOUS
  Filled 2012-03-09 (×2): qty 50

## 2012-03-09 MED ORDER — NEOSTIGMINE METHYLSULFATE 1 MG/ML IJ SOLN
INTRAMUSCULAR | Status: DC | PRN
  Administered 2012-03-09: 3.5 mg via INTRAVENOUS

## 2012-03-09 MED ORDER — PRAMIPEXOLE DIHYDROCHLORIDE 0.25 MG PO TABS
0.5000 mg | ORAL_TABLET | Freq: Every day | ORAL | Status: DC
  Administered 2012-03-09 – 2012-03-10 (×2): 0.5 mg via ORAL
  Filled 2012-03-09 (×3): qty 2

## 2012-03-09 MED ORDER — HYDROMORPHONE HCL PF 1 MG/ML IJ SOLN
INTRAMUSCULAR | Status: AC
  Filled 2012-03-09: qty 1

## 2012-03-09 MED ORDER — ACETAMINOPHEN 325 MG PO TABS: 650.0000 mg | ORAL_TABLET | ORAL | Status: DC | PRN

## 2012-03-09 MED ORDER — ALBUMIN HUMAN 5 % IV SOLN
INTRAVENOUS | Status: DC | PRN
  Administered 2012-03-09: 14:00:00 via INTRAVENOUS

## 2012-03-09 MED ORDER — OXYCODONE-ACETAMINOPHEN 5-325 MG PO TABS
1.0000 | ORAL_TABLET | ORAL | Status: DC | PRN
  Administered 2012-03-09 – 2012-03-11 (×10): 2 via ORAL
  Filled 2012-03-09 (×9): qty 2

## 2012-03-09 MED ORDER — LIDOCAINE HCL 4 % MT SOLN
OROMUCOSAL | Status: DC | PRN
  Administered 2012-03-09: 4 mL via TOPICAL

## 2012-03-09 MED ORDER — SODIUM CHLORIDE 0.9 % IJ SOLN
3.0000 mL | Freq: Two times a day (BID) | INTRAMUSCULAR | Status: DC
  Administered 2012-03-09 – 2012-03-10 (×3): 3 mL via INTRAVENOUS

## 2012-03-09 MED ORDER — THROMBIN 20000 UNITS EX SOLR
CUTANEOUS | Status: DC | PRN
  Administered 2012-03-09: 11:00:00 via TOPICAL

## 2012-03-09 MED ORDER — FENTANYL CITRATE 0.05 MG/ML IJ SOLN
INTRAMUSCULAR | Status: DC | PRN
  Administered 2012-03-09: 50 ug via INTRAVENOUS
  Administered 2012-03-09: 100 ug via INTRAVENOUS
  Administered 2012-03-09 (×6): 50 ug via INTRAVENOUS

## 2012-03-09 MED ORDER — ACETAMINOPHEN 650 MG RE SUPP: 650.0000 mg | RECTAL | Status: DC | PRN

## 2012-03-09 MED ORDER — CYCLOBENZAPRINE HCL 10 MG PO TABS
10.0000 mg | ORAL_TABLET | Freq: Three times a day (TID) | ORAL | Status: DC | PRN
  Administered 2012-03-09 – 2012-03-10 (×3): 10 mg via ORAL
  Filled 2012-03-09 (×2): qty 1

## 2012-03-09 MED ORDER — ARTIFICIAL TEARS OP OINT
TOPICAL_OINTMENT | OPHTHALMIC | Status: DC | PRN
  Administered 2012-03-09: 1 via OPHTHALMIC

## 2012-03-09 MED ORDER — KCL IN DEXTROSE-NACL 20-5-0.45 MEQ/L-%-% IV SOLN
80.0000 mL/h | INTRAVENOUS | Status: DC
  Administered 2012-03-09 – 2012-03-10 (×2): 80 mL/h via INTRAVENOUS
  Filled 2012-03-09 (×5): qty 1000

## 2012-03-09 MED ORDER — POTASSIUM CHLORIDE CRYS ER 20 MEQ PO TBCR
20.0000 meq | EXTENDED_RELEASE_TABLET | Freq: Two times a day (BID) | ORAL | Status: DC
  Administered 2012-03-09 – 2012-03-11 (×4): 20 meq via ORAL
  Filled 2012-03-09 (×5): qty 1

## 2012-03-09 MED ORDER — SODIUM CHLORIDE 0.9 % IR SOLN
Status: DC | PRN
  Administered 2012-03-09: 11:00:00

## 2012-03-09 MED ORDER — ZOLPIDEM TARTRATE 5 MG PO TABS: 5.0000 mg | ORAL_TABLET | Freq: Every evening | ORAL | Status: DC | PRN

## 2012-03-09 MED ORDER — 0.9 % SODIUM CHLORIDE (POUR BTL) OPTIME
TOPICAL | Status: DC | PRN
  Administered 2012-03-09: 1000 mL

## 2012-03-09 MED ORDER — ALBUTEROL SULFATE HFA 108 (90 BASE) MCG/ACT IN AERS
2.0000 | INHALATION_SPRAY | Freq: Four times a day (QID) | RESPIRATORY_TRACT | Status: DC | PRN
  Filled 2012-03-09: qty 6.7

## 2012-03-09 MED ORDER — GLYCOPYRROLATE 0.2 MG/ML IJ SOLN
INTRAMUSCULAR | Status: DC | PRN
  Administered 2012-03-09: 0.2 mg via INTRAVENOUS
  Administered 2012-03-09: 0.6 mg via INTRAVENOUS

## 2012-03-09 MED ORDER — SIMVASTATIN 40 MG PO TABS
40.0000 mg | ORAL_TABLET | Freq: Every evening | ORAL | Status: DC
Start: 2012-03-09 — End: 2012-03-11
  Administered 2012-03-09 – 2012-03-10 (×2): 40 mg via ORAL
  Filled 2012-03-09 (×3): qty 1

## 2012-03-09 MED ORDER — EPHEDRINE SULFATE 50 MG/ML IJ SOLN
INTRAMUSCULAR | Status: DC | PRN
  Administered 2012-03-09 (×6): 10 mg via INTRAVENOUS

## 2012-03-09 MED ORDER — DULOXETINE HCL 60 MG PO CPEP
60.0000 mg | ORAL_CAPSULE | Freq: Every day | ORAL | Status: DC
Start: 2012-03-10 — End: 2012-03-11
  Administered 2012-03-10 – 2012-03-11 (×2): 60 mg via ORAL
  Filled 2012-03-09 (×2): qty 1

## 2012-03-09 MED ORDER — ONDANSETRON HCL 4 MG/2ML IJ SOLN
INTRAMUSCULAR | Status: DC | PRN
  Administered 2012-03-09: 4 mg via INTRAVENOUS

## 2012-03-09 MED ORDER — ONDANSETRON HCL 4 MG/2ML IJ SOLN: 4.0000 mg | INTRAMUSCULAR | Status: DC | PRN

## 2012-03-09 MED ORDER — OXYCODONE-ACETAMINOPHEN 5-325 MG PO TABS
ORAL_TABLET | ORAL | Status: AC
  Filled 2012-03-09: qty 2

## 2012-03-09 MED ORDER — PHENOL 1.4 % MT LIQD: 1.0000 | OROMUCOSAL | Status: DC | PRN

## 2012-03-09 MED ORDER — ESTRADIOL 2 MG PO TABS
2.0000 mg | ORAL_TABLET | Freq: Every day | ORAL | Status: DC
  Administered 2012-03-10 – 2012-03-11 (×2): 2 mg via ORAL
  Filled 2012-03-09 (×2): qty 1

## 2012-03-09 MED ORDER — HYDROMORPHONE HCL PF 1 MG/ML IJ SOLN
0.2500 mg | INTRAMUSCULAR | Status: DC | PRN
  Administered 2012-03-09 (×4): 0.5 mg via INTRAVENOUS

## 2012-03-09 MED ORDER — MENTHOL 3 MG MT LOZG: 1.0000 | LOZENGE | OROMUCOSAL | Status: DC | PRN

## 2012-03-09 MED ORDER — SODIUM CHLORIDE 0.9 % IV SOLN: 250.0000 mL | INTRAVENOUS | Status: DC

## 2012-03-09 MED ORDER — PHENYLEPHRINE HCL 10 MG/ML IJ SOLN
INTRAMUSCULAR | Status: DC | PRN
  Administered 2012-03-09 (×3): 40 ug via INTRAVENOUS

## 2012-03-09 MED ORDER — PREGABALIN 50 MG PO CAPS: 100.0000 mg | ORAL_CAPSULE | Freq: Three times a day (TID) | ORAL | Status: DC | PRN

## 2012-03-09 MED ORDER — ONDANSETRON HCL 4 MG/2ML IJ SOLN: 4.0000 mg | Freq: Once | INTRAMUSCULAR | Status: DC | PRN

## 2012-03-09 MED ORDER — NADOLOL 40 MG PO TABS
40.0000 mg | ORAL_TABLET | Freq: Every day | ORAL | Status: DC
  Administered 2012-03-10 – 2012-03-11 (×2): 40 mg via ORAL
  Filled 2012-03-09 (×2): qty 1

## 2012-03-09 MED ORDER — SODIUM CHLORIDE 0.9 % IJ SOLN: 3.0000 mL | INTRAMUSCULAR | Status: DC | PRN

## 2012-03-09 MED ORDER — ROCURONIUM BROMIDE 100 MG/10ML IV SOLN
INTRAVENOUS | Status: DC | PRN
  Administered 2012-03-09 (×2): 10 mg via INTRAVENOUS
  Administered 2012-03-09: 20 mg via INTRAVENOUS
  Administered 2012-03-09: 50 mg via INTRAVENOUS
  Administered 2012-03-09: 20 mg via INTRAVENOUS

## 2012-03-09 MED ORDER — FENTANYL CITRATE 0.05 MG/ML IJ SOLN
INTRAMUSCULAR | Status: AC
  Administered 2012-03-09: 50 ug
  Filled 2012-03-09: qty 2

## 2012-03-09 MED ORDER — FLUTICASONE PROPIONATE 50 MCG/ACT NA SUSP
2.0000 | Freq: Every day | NASAL | Status: DC
  Administered 2012-03-09 – 2012-03-10 (×2): 2 via NASAL
  Filled 2012-03-09: qty 16

## 2012-03-09 MED ORDER — BACITRACIN 50000 UNITS IM SOLR
INTRAMUSCULAR | Status: AC
  Filled 2012-03-09: qty 1

## 2012-03-09 MED ORDER — BUMETANIDE 1 MG PO TABS
1.0000 mg | ORAL_TABLET | Freq: Two times a day (BID) | ORAL | Status: DC
  Administered 2012-03-09 – 2012-03-11 (×4): 1 mg via ORAL
  Filled 2012-03-09 (×5): qty 1

## 2012-03-09 MED ORDER — PANTOPRAZOLE SODIUM 40 MG IV SOLR
40.0000 mg | Freq: Every day | INTRAVENOUS | Status: DC
  Administered 2012-03-09: 40 mg via INTRAVENOUS
  Filled 2012-03-09 (×2): qty 40

## 2012-03-09 MED ORDER — SODIUM CHLORIDE 0.9 % IV SOLN
INTRAVENOUS | Status: AC
  Filled 2012-03-09: qty 500

## 2012-03-09 MED ORDER — LACTATED RINGERS IV SOLN
INTRAVENOUS | Status: DC | PRN
  Administered 2012-03-09 (×3): via INTRAVENOUS

## 2012-03-09 MED ORDER — HYDROMORPHONE HCL PF 1 MG/ML IJ SOLN
1.0000 mg | INTRAMUSCULAR | Status: DC | PRN
  Administered 2012-03-09 – 2012-03-10 (×3): 1 mg via INTRAMUSCULAR
  Administered 2012-03-10: 1.5 mg via INTRAMUSCULAR
  Administered 2012-03-10 – 2012-03-11 (×3): 1 mg via INTRAMUSCULAR
  Filled 2012-03-09 (×4): qty 1
  Filled 2012-03-09: qty 2
  Filled 2012-03-09 (×2): qty 1

## 2012-03-09 MED ORDER — DEXAMETHASONE SODIUM PHOSPHATE 4 MG/ML IJ SOLN
INTRAMUSCULAR | Status: DC | PRN
  Administered 2012-03-09: 8 mg via INTRAVENOUS

## 2012-03-09 MED ORDER — PROPOFOL 10 MG/ML IV BOLUS
INTRAVENOUS | Status: DC | PRN
  Administered 2012-03-09: 120 mg via INTRAVENOUS

## 2012-03-09 MED ORDER — ALPRAZOLAM 0.5 MG PO TABS: 1.0000 mg | ORAL_TABLET | Freq: Three times a day (TID) | ORAL | Status: DC | PRN

## 2012-03-09 SURGICAL SUPPLY — 57 items
ADH SKN CLS APL DERMABOND .7 (GAUZE/BANDAGES/DRESSINGS) ×3
BAG DECANTER FOR FLEXI CONT (MISCELLANEOUS) ×2 IMPLANT
BENZOIN TINCTURE PRP APPL 2/3 (GAUZE/BANDAGES/DRESSINGS) ×2 IMPLANT
BLADE SURG ROTATE 9660 (MISCELLANEOUS) IMPLANT
BONE EQUIVA 10CC (Bone Implant) ×6 IMPLANT
BRUSH SCRUB EZ PLAIN DRY (MISCELLANEOUS) ×2 IMPLANT
CANISTER SUCTION 2500CC (MISCELLANEOUS) ×2 IMPLANT
CLOTH BEACON ORANGE TIMEOUT ST (SAFETY) ×2 IMPLANT
CONT SPEC 4OZ CLIKSEAL STRL BL (MISCELLANEOUS) ×4 IMPLANT
COVER BACK TABLE 24X17X13 BIG (DRAPES) IMPLANT
COVER TABLE BACK 60X90 (DRAPES) ×2 IMPLANT
DERMABOND ADVANCED (GAUZE/BANDAGES/DRESSINGS) ×3
DERMABOND ADVANCED .7 DNX12 (GAUZE/BANDAGES/DRESSINGS) ×3 IMPLANT
DRAPE C-ARM 42X72 X-RAY (DRAPES) ×4 IMPLANT
DRAPE LAPAROTOMY 100X72X124 (DRAPES) ×2 IMPLANT
DRAPE SURG 17X23 STRL (DRAPES) IMPLANT
DRESSING TELFA 8X3 (GAUZE/BANDAGES/DRESSINGS) IMPLANT
ELECT CAUTERY BLADE 6.4 (BLADE) ×2 IMPLANT
ELECT REM PT RETURN 9FT ADLT (ELECTROSURGICAL) ×2
ELECTRODE REM PT RTRN 9FT ADLT (ELECTROSURGICAL) ×1 IMPLANT
EVACUATOR 1/8 PVC DRAIN (DRAIN) ×2 IMPLANT
GAUZE SPONGE 4X4 16PLY XRAY LF (GAUZE/BANDAGES/DRESSINGS) ×2 IMPLANT
GLOVE ECLIPSE 7.5 STRL STRAW (GLOVE) ×4 IMPLANT
GOWN BRE IMP SLV AUR LG STRL (GOWN DISPOSABLE) ×2 IMPLANT
GOWN BRE IMP SLV AUR XL STRL (GOWN DISPOSABLE) ×6 IMPLANT
GOWN STRL REIN 2XL LVL4 (GOWN DISPOSABLE) ×4 IMPLANT
KIT BASIN OR (CUSTOM PROCEDURE TRAY) ×2 IMPLANT
KIT INFUSE LRG II (Orthopedic Implant) ×2 IMPLANT
KIT ROOM TURNOVER OR (KITS) ×2 IMPLANT
MARKER SKIN DUAL TIP RULER LAB (MISCELLANEOUS) ×2 IMPLANT
NEEDLE HYPO 22GX1.5 SAFETY (NEEDLE) ×2 IMPLANT
NS IRRIG 1000ML POUR BTL (IV SOLUTION) ×2 IMPLANT
PACK LAMINECTOMY NEURO (CUSTOM PROCEDURE TRAY) ×2 IMPLANT
PAD ARMBOARD 7.5X6 YLW CONV (MISCELLANEOUS) ×6 IMPLANT
PATTIES SURGICAL .75X.75 (GAUZE/BANDAGES/DRESSINGS) IMPLANT
PEDIGUARD CURV (INSTRUMENTS) ×2 IMPLANT
PENCIL BUTTON HOLSTER BLD 10FT (ELECTRODE) ×2 IMPLANT
ROD STRAIGHT 510MM 5.5 (Rod) ×2 IMPLANT
SCREW SEQUOIA 5.5X40MM (Screw) ×12 IMPLANT
SCREW SEQUOIA 7.5X40 (Screw) ×8 IMPLANT
SPONGE GAUZE 4X4 12PLY (GAUZE/BANDAGES/DRESSINGS) IMPLANT
SPONGE LAP 4X18 X RAY DECT (DISPOSABLE) IMPLANT
SPONGE SURGIFOAM ABS GEL 100 (HEMOSTASIS) ×2 IMPLANT
STRIP CLOSURE SKIN 1/2X4 (GAUZE/BANDAGES/DRESSINGS) ×26 IMPLANT
SUT VIC AB 0 CT1 18XCR BRD8 (SUTURE) ×2 IMPLANT
SUT VIC AB 0 CT1 8-18 (SUTURE) ×4
SUT VIC AB 2-0 OS6 18 (SUTURE) ×10 IMPLANT
SUT VIC AB 3-0 CP2 18 (SUTURE) ×4 IMPLANT
SYR 20ML ECCENTRIC (SYRINGE) ×2 IMPLANT
TOOL FLUTED BALL 7MM (MISCELLANEOUS) ×2 IMPLANT
TOOL MATCHSTK 3MM (MISCELLANEOUS) ×2 IMPLANT
TOP CLSR SEQUOIA (Orthopedic Implant) ×20 IMPLANT
TOWEL OR 17X24 6PK STRL BLUE (TOWEL DISPOSABLE) ×2 IMPLANT
TOWEL OR 17X26 10 PK STRL BLUE (TOWEL DISPOSABLE) ×2 IMPLANT
TRAP SPECIMEN MUCOUS 40CC (MISCELLANEOUS) IMPLANT
TRAY FOLEY CATH 14FRSI W/METER (CATHETERS) ×2 IMPLANT
WATER STERILE IRR 1000ML POUR (IV SOLUTION) ×2 IMPLANT

## 2012-03-09 NOTE — Anesthesia Preprocedure Evaluation (Addendum)
Anesthesia Evaluation  Patient identified by MRN, date of birth, ID band Patient awake    Reviewed: Allergy & Precautions, H&P , NPO status , Patient's Chart, lab work & pertinent test results, reviewed documented beta blocker date and time   History of Anesthesia Complications (+) PONV  Airway Mallampati: I TM Distance: >3 FB Neck ROM: full    Dental  (+) Edentulous Upper, Edentulous Lower and Dental Advisory Given   Pulmonary Current Smoker,  + rhonchi         Cardiovascular hypertension, Pt. on medications Rhythm:regular Rate:Normal     Neuro/Psych  Headaches,  Neuromuscular disease    GI/Hepatic hiatal hernia, GERD-  Medicated and Controlled,  Endo/Other    Renal/GU      Musculoskeletal  (+) Fibromyalgia -  Abdominal (+)  Abdomen: soft. Bowel sounds: normal.  Peds  Hematology   Anesthesia Other Findings   Reproductive/Obstetrics                         Anesthesia Physical Anesthesia Plan  ASA: II  Anesthesia Plan: General   Post-op Pain Management:    Induction: Intravenous  Airway Management Planned: Oral ETT  Additional Equipment:   Intra-op Plan:   Post-operative Plan: Extubation in OR  Informed Consent: I have reviewed the patients History and Physical, chart, labs and discussed the procedure including the risks, benefits and alternatives for the proposed anesthesia with the patient or authorized representative who has indicated his/her understanding and acceptance.     Plan Discussed with: CRNA, Anesthesiologist and Surgeon  Anesthesia Plan Comments:         Anesthesia Quick Evaluation

## 2012-03-09 NOTE — Anesthesia Procedure Notes (Signed)
Procedure Name: Intubation Date/Time: 03/09/2012 10:39 AM Performed by: Ellin Goodie Pre-anesthesia Checklist: Patient identified, Emergency Drugs available, Suction available, Patient being monitored and Timeout performed Patient Re-evaluated:Patient Re-evaluated prior to inductionOxygen Delivery Method: Circle system utilized Preoxygenation: Pre-oxygenation with 100% oxygen Intubation Type: IV induction Ventilation: Mask ventilation without difficulty Laryngoscope Size: Mac and 3 Grade View: Grade I Tube type: Oral Tube size: 7.5 mm Number of attempts: 1 Airway Equipment and Method: Stylet Placement Confirmation: ETT inserted through vocal cords under direct vision,  positive ETCO2 and breath sounds checked- equal and bilateral Secured at: 21 cm Tube secured with: Tape Dental Injury: Teeth and Oropharynx as per pre-operative assessment

## 2012-03-09 NOTE — H&P (Signed)
Mackenzie Butler is an 56 y.o. female.   Chief Complaint: Back pain HPI: The patient is a 56 year old female who had a posterior lumbar interbody fusion at L3-4 and L4-5 a number of years ago. She then underwent a lateral fusion at L2-3 with extension of her hardware. She did well after both procedures but recently began to develop severe back pain. 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 We felt this was most likely her problem after discussing the options elected to bring her to the operating room remove her old instrumentation placed instrumentation in the lower lumbar spine and extend instrumentation up to the lower thoracic region and a posterior lateral fusion along the entire length. I had a long discussion with her regarding the risks and benefits of surgical intervention. The risks discussed include but are not limited to bleeding infection weakness numbness: Instrumentation persistent nonunion persistent pain, and death. We have discussed alternative methods of therapy offered risks and benefits of nonintervention. She has had the opportunity to ask numerous questions and appears to understand. With this information in hand she has requested we proceed with surgery.  Past Medical History  Diagnosis Date  . PONV (postoperative nausea and vomiting)   . Hypertension   . Dysrhythmia     IRREG HEARTBEAT  . GERD (gastroesophageal reflux disease)   . H/O hiatal hernia   . Headache     HX MIGRAINES  . Fibromyalgia   . Arthritis     Past Surgical History  Procedure Date  . Abdominal hysterectomy   . Back surgery     FUSION 2009 (4 TOTAL)  . Cervical spine surgery     6 YEARS  . Cholecystectomy     History reviewed. No pertinent family history. Social History:  reports that she has been smoking Cigarettes.  She has a 34 pack-year smoking history. She has never used smokeless  tobacco. She reports that she does not drink alcohol or use illicit drugs.  Allergies: No Known Allergies  Medications Prior to Admission  Medication Sig Dispense Refill  . albuterol (PROVENTIL HFA;VENTOLIN HFA) 108 (90 BASE) MCG/ACT inhaler Inhale 2 puffs into the lungs every 6 (six) hours as needed. For shortness of breath      . ALPRAZolam (XANAX) 1 MG tablet Take 1 mg by mouth 3 (three) times daily as needed. For anxiety      . aspirin 81 MG tablet Take 81 mg by mouth daily.      . budesonide-formoterol (SYMBICORT) 160-4.5 MCG/ACT inhaler Inhale 2 puffs into the lungs 2 (two) times daily.      . bumetanide (BUMEX) 1 MG tablet Take 1 mg by mouth 2 (two) times daily.      . butalbital-acetaminophen-caffeine (FIORICET, ESGIC) 50-325-40 MG per tablet Take 1 tablet by mouth 2 (two) times daily as needed. For migraines      . carisoprodol (SOMA) 350 MG tablet Take 350 mg by mouth 4 (four) times daily as needed. For muscle pain      . DULoxetine (CYMBALTA) 60 MG capsule Take 60 mg by mouth daily.      Marland Kitchen esomeprazole (NEXIUM) 40 MG capsule Take 40 mg by mouth daily before breakfast.      . estradiol (ESTRACE) 2 MG tablet Take 2 mg by mouth daily.      . fish oil-omega-3 fatty acids 1000 MG capsule Take  4 g by mouth 2 (two) times daily.      . fluticasone (FLONASE) 50 MCG/ACT nasal spray Place 2 sprays into the nose daily.      . folic acid (FOLVITE) 1 MG tablet Take 1 mg by mouth 2 (two) times daily.      . magnesium oxide (MAG-OX) 400 MG tablet Take 400 mg by mouth 2 (two) times daily.      . nadolol (CORGARD) 40 MG tablet Take 40 mg by mouth daily.      Marland Kitchen oxycodone (ROXICODONE) 30 MG immediate release tablet Take 30 mg by mouth every 6 (six) hours as needed. For pain      . potassium chloride SA (K-DUR,KLOR-CON) 20 MEQ tablet Take 20 mEq by mouth 2 (two) times daily.      . pramipexole (MIRAPEX) 0.25 MG tablet Take 0.5 mg by mouth at bedtime.      . pregabalin (LYRICA) 100 MG capsule Take 100  mg by mouth 3 (three) times daily as needed. For pain      . promethazine (PHENERGAN) 25 MG tablet Take 25 mg by mouth every 6 (six) hours as needed.      . simvastatin (ZOCOR) 40 MG tablet Take 40 mg by mouth every evening.      . Vitamin D, Ergocalciferol, (DRISDOL) 50000 UNITS CAPS Take 50,000 Units by mouth every 7 (seven) days. Take on saturday      . zolpidem (AMBIEN) 10 MG tablet Take 10 mg by mouth at bedtime as needed. For sleep        No results found for this or any previous visit (from the past 48 hour(s)). No results found.  Review of systems not obtained due to patient factors.  Blood pressure 100/67, pulse 59, temperature 97.3 F (36.3 C), temperature source Oral, resp. rate 18, SpO2 99.00%.  The patient is awake or and oriented. She is no facial asymmetry. Her gait is slow and antalgic. Strength is intact. Reflexes are decreased but equal. Assessment/Plan Impression is that of marked was were position of the L2 vertebral body. The plan is for extension of instrumentation with a posterior lateral fusion with a nonunion. We have checked her sedimentation rate prior to surgery and is not elevated I do not believe this is a problem with infection.  Reinaldo Meeker, MD 03/09/2012, 10:24 AM

## 2012-03-09 NOTE — Anesthesia Postprocedure Evaluation (Signed)
  Anesthesia Post-op Note  Patient: Mackenzie Butler  Procedure(s) Performed: Procedure(s) (LRB) with comments: POSTERIOR LUMBAR FUSION 4 WITH HARDWARE REMOVAL (N/A) - Removal of Hardware Lumbar Two-Five; Segmental Pedicle Screw Instrumentation Thoracic Eleven-Lumbar One and Lumbar Four-Five with Thoracic Eleven-Lumbar Five Posterior Lateral Fusion  Patient Location: PACU  Anesthesia Type:General  Level of Consciousness: awake, oriented, sedated and patient cooperative  Airway and Oxygen Therapy: Patient Spontanous Breathing  Post-op Pain: mild  Post-op Assessment: Post-op Vital signs reviewed, Patient's Cardiovascular Status Stable, Respiratory Function Stable, Patent Airway, No signs of Nausea or vomiting and Pain level controlled  Post-op Vital Signs: stable  Complications: No apparent anesthesia complications

## 2012-03-09 NOTE — Op Note (Signed)
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 Previous lumbar incision was opened and extended superiorly to the lower thoracic region. We went along the subperiosteal dissection of T11-T12 and L1 and then dissected up the previous instrumentation at L2 L3-L4 and L5. We removed instrumentation it was oriented and placed a new 7.5 x 40 mm screws at L4 and L5 bilaterally. We then placed pedicle screw instrumentation at T11-T12 and L1 bilaterally. We used AP fluoroscopy to identify a good entry point and then made a small opening with the drill. We used the ultrasound pedicle probe to allow safe passage which were able to do successfully. We then tapped with a 5.0 mm tap and placed 5.5 x 40 mm screws at T11-T12 and L1 bilaterally. We then measured and cut an appropriately length rod which was easy to placing the top of the screws. We then decorticated the far lateral region as well as the lamina and a posterior lateral fusion with a mixture of morselized allograft and BMP. We then secured the rod to the top of the screws and tightened with the top loading nuts. We did final tightening with torque and counter torque and final fluoroscopy in AP lateral direction looked excellent. Left an epidural drain in the epidural space and brought out through a separate stab incision. The was then closed in multiple layers of Vicryl on the muscle fascia subcutaneous and subcuticular tissues. Dermabond Steri-Strips were placed on the skin. Shortness was then applied the patient was extubated and taken to recovery in stable condition.

## 2012-03-09 NOTE — Transfer of Care (Signed)
Immediate Anesthesia Transfer of Care Note  Patient: [name]  Procedure(s) Performed: Procedure(s) (LRB) with comments: POSTERIOR LUMBAR FUSION 4 WITH HARDWARE REMOVAL (N/A) - Removal of Hardware Lumbar Two-Five; Segmental Pedicle Screw Instrumentation Thoracic Eleven-Lumbar One and Lumbar Four-Five with Thoracic Eleven-Lumbar Five Posterior Lateral Fusion  Patient Location: PACU  Anesthesia Type:General  Level of Consciousness: awake, alert  and oriented  Airway & Oxygen Therapy: Patient Spontanous Breathing  Post-op Assessment: Report given to PACU RN  Post vital signs: stable  Complications: No apparent anesthesia complications

## 2012-03-09 NOTE — Progress Notes (Signed)
Report given to Trevia Nop RN.

## 2012-03-10 MED ORDER — PNEUMOCOCCAL VAC POLYVALENT 25 MCG/0.5ML IJ INJ
0.5000 mL | INJECTION | INTRAMUSCULAR | Status: AC
  Administered 2012-03-11: 0.5 mL via INTRAMUSCULAR
  Filled 2012-03-10: qty 0.5

## 2012-03-10 MED ORDER — PANTOPRAZOLE SODIUM 40 MG PO TBEC
40.0000 mg | DELAYED_RELEASE_TABLET | Freq: Every day | ORAL | Status: DC
  Administered 2012-03-10 – 2012-03-11 (×2): 40 mg via ORAL
  Filled 2012-03-10 (×2): qty 1

## 2012-03-10 NOTE — Progress Notes (Signed)
Pt ambulated in the room this am, tolerated ambulation well, back to bed without incidence.  Foley D/C.  Pt resting, will continue to monitor.

## 2012-03-10 NOTE — Evaluation (Signed)
Physical Therapy Evaluation Patient Details Name: Mackenzie Butler MRN: [id number] DOB: 1955/08/21 Today's Date: 03/10/2012 Time: 6644-0347 PT Time Calculation (min): 17 min  PT Assessment / Plan / Recommendation Clinical Impression  Pt s/p PLF L2-5. Pt limited this session secondary to increased pain with all mobility(pain 9/10). Pt will benefit from skilled PT in the acute care setting in order to maximize functional mobility and strength prior to d/c home with family    PT Assessment  Patient needs continued PT services    Follow Up Recommendations  No PT follow up;Supervision for mobility/OOB    Does the patient have the potential to tolerate intense rehabilitation      Barriers to Discharge        Equipment Recommendations  None recommended by PT    Recommendations for Other Services     Frequency Min 5X/week    Precautions / Restrictions Precautions Precautions: Back Precaution Booklet Issued: Yes (comment) Precaution Comments: pt educated on 3/3 back precautions Required Braces or Orthoses: Spinal Brace Spinal Brace: Lumbar corset;Applied in sitting position Restrictions Weight Bearing Restrictions: No   Pertinent Vitals/Pain Pain 9/10.       Mobility  Bed Mobility Bed Mobility: Rolling Right;Right Sidelying to Sit;Sitting - Scoot to [place] of Bed Rolling Right: 4: Min guard Right Sidelying to Sit: 4: Min assist Sitting - Scoot to Edge of Bed: 4: Min guard Details for Bed Mobility Assistance: VC for safe technique to maintain back precautions while performing log roll. Min assist into sitting through pelvis Transfers Transfers: Sit to Stand;Stand to Sit Sit to Stand: 4: Min assist;With upper extremity assist;From bed Stand to Sit: 4: Min assist;With upper extremity assist;To chair/3-in-1 Details for Transfer Assistance: Min assist for stability and support into standing. Cues for safe technique and proper hand placement Ambulation/Gait Ambulation/Gait  Assistance: 4: Min guard Ambulation Distance (Feet): 30 Feet Assistive device: Rolling walker Ambulation/Gait Assistance Details: Distance limited secondary to pain. Cues for upright posture thruoghout and safe distance to RW for increased support. Slow antalgic gait Gait Pattern: Step-to pattern;Decreased stride length;Decreased hip/knee flexion - right;Decreased hip/knee flexion - left;Antalgic;Narrow base of support Gait velocity: slow gait speed Stairs: No    Shoulder Instructions     Exercises     PT Diagnosis: Difficulty walking;Acute pain  PT Problem List: Decreased strength;Decreased activity tolerance;Decreased mobility;Decreased knowledge of use of DME;Decreased knowledge of precautions;Decreased safety awareness;Pain PT Treatment Interventions: DME instruction;Gait training;Stair training;Functional mobility training;Therapeutic activities;Patient/family education   PT Goals Acute Rehab PT Goals PT Goal Formulation: With patient Time For Goal Achievement: 03/17/12 Potential to Achieve Goals: Good Pt will go Supine/Side to Sit: with modified independence PT Goal: Supine/Side to Sit - Progress: Goal set today Pt will go Sit to Supine/Side: with modified independence PT Goal: Sit to Supine/Side - Progress: Goal set today Pt will go Sit to Stand: with modified independence PT Goal: Sit to Stand - Progress: Goal set today Pt will go Stand to Sit: with modified independence PT Goal: Stand to Sit - Progress: Goal set today Pt will Transfer Bed to Chair/Chair to Bed: with modified independence PT Transfer Goal: Bed to Chair/Chair to Bed - Progress: Goal set today Pt will Ambulate: >150 feet;with modified independence;with least restrictive assistive device PT Goal: Ambulate - Progress: Goal set today Pt will Go Up / Down Stairs: 1-2 stairs;with supervision PT Goal: Up/Down Stairs - Progress: Goal set today  Visit Information  Last PT Received On: 03/10/12 Assistance Needed:  +1    Subjective  Data  Patient Stated Goal: to decrease pain   Prior Functioning  Home Living Lives With: Significant other Available Help at Discharge: Family;Available 24 hours/day Type of Home: House Home Access: Stairs to enter [employer] of Steps: 1 Entrance Stairs-Rails: None Home Layout: One level Bathroom Shower/Tub: Forensic scientist: Standard Bathroom Accessibility: Yes How Accessible: Accessible via walker Home Adaptive Equipment: Walker - rolling Prior Function Level of Independence: Independent Able to Take Stairs?: Yes Driving: Yes Vocation: Retired Comments: has assistance with housework Communication Communication: No difficulties Dominant Hand: Right    Cognition  Overall Cognitive Status: Appears within functional limits for tasks assessed/performed Arousal/Alertness: Awake/alert Orientation Level: Appears intact for tasks assessed Behavior During Session: [facility] for tasks performed    Extremity/Trunk Assessment Right Lower Extremity Assessment RLE ROM/Strength/Tone: Within functional levels RLE Sensation: WFL - Light Touch Left Lower Extremity Assessment LLE ROM/Strength/Tone: Deficits LLE ROM/Strength/Tone Deficits: grossly 4/5. Pain with MMT of hip LLE Sensation: WFL - Light Touch   Balance    End of Session PT - End of Session Equipment Utilized During Treatment: Gait belt;Back brace Activity Tolerance: Patient limited by pain Patient left: in chair;with call bell/phone within reach Nurse Communication: Mobility status  GP     Milana Kidney 03/10/2012, 8:37 AM

## 2012-03-10 NOTE — Progress Notes (Signed)
Patient ID: Mackenzie Butler, female   DOB: 1955-05-26, 56 y.o.   MRN: [id number] Subjective: Patient reports doing well  Objective: Vital signs in last 24 hours: Temp:  [97.1 F (36.2 C)-98 F (36.7 C)] 97.9 F (36.6 C) (12/20 1018) Pulse Rate:  [44-70] 69  (12/20 1018) Resp:  [9-18] 16  (12/20 1018) BP: (76-109)/(43-64) 97/54 mmHg (12/20 1018) SpO2:  [95 %-100 %] 95 % (12/20 1018) Weight:  [58.968 kg (130 lb)] 58.968 kg (130 lb) (12/20 0500)  Intake/Output from previous day: 12/19 0701 - 12/20 0700 In: 3750 [I.V.:3500; IV Piggyback:250] Out: 1880 [Urine:1555; Drains:175; Blood:150] Intake/Output this shift:    Wound:clean and dry  Lab Results: No results found for this basename: WBC:2,HGB:2,HCT:2,PLT:2 in the last 72 hours BMET No results found for this basename: NA:2,K:2,CL:2,CO2:2,GLUCOSE:2,BUN:2,CREATININE:2,CALCIUM:2 in the last 72 hours  Studies/Results: Dg Thoracolumabar Spine  03/09/2012  *RADIOLOGY REPORT*  Clinical Data: Back pain.  Pseudarthrosis L2-L3.  DG C-ARM 61-120 MIN,THORACOLUMBAR SPINE - 2 VIEW  Comparison: Multiple priors.  Findings: C-arm films document removal of hardware from L2-L5 followed by T11-L5 posterolateral fusion.  IMPRESSION: As above.   Original Report Authenticated By: Davonna Belling, M.D.    Dg C-arm [phone] Min  03/09/2012  *RADIOLOGY REPORT*  Clinical Data: Back pain.  Pseudarthrosis L2-L3.  DG C-ARM 61-120 MIN,THORACOLUMBAR SPINE - 2 VIEW  Comparison: Multiple priors.  Findings: C-arm films document removal of hardware from L2-L5 followed by T11-L5 posterolateral fusion.  IMPRESSION: As above.   Original Report Authenticated By: Davonna Belling, M.D.     Assessment/Plan: Doing well POD 1. Pain not as bad as expected. She thinks she will probably be ready for d/c tomorrow. Will increase activity today.  LOS: 1 day  as above   Reinaldo Meeker, MD 03/10/2012, 10:35 AM

## 2012-03-10 NOTE — Evaluation (Signed)
Occupational Therapy Evaluation Patient Details Name: LENORA GOMES MRN: [id number] DOB: 1955-04-15 Today's Date: 03/10/2012 Time: 0865-7846 OT Time Calculation (min): 15 min  OT Assessment / Plan / Recommendation Clinical Impression  Pt admitted for PLIF, has had multiple back surgeries.  Pt limited by pain.  Will follow to address DME needs and ADL following her back precautions.  Anticipate pt will be able to go home with supervision.    OT Assessment  Patient needs continued OT Services    Follow Up Recommendations  No OT follow up;Supervision/Assistance - 24 hour    Barriers to Discharge      Equipment Recommendations  3 in 1 bedside comode;Other (comment) (will determine tub equipment needs.)    Recommendations for Other Services    Frequency  Min 2X/week    Precautions / Restrictions Precautions Precautions: Back Precaution Booklet Issued: Yes (comment) Precaution Comments: pt educated on 3/3 back precautions Required Braces or Orthoses: Spinal Brace Spinal Brace: Lumbar corset;Applied in sitting position Restrictions Weight Bearing Restrictions: No   Pertinent Vitals/Pain 9/10 back, repositioned, RN aware, premedicated    ADL  Eating/Feeding: Independent Where Assessed - Eating/Feeding: Chair Grooming: Wash/dry hands;Min guard Where Assessed - Grooming: Unsupported standing Upper Body Bathing: Minimal assistance Where Assessed - Upper Body Bathing: Unsupported sitting Lower Body Bathing: Maximal assistance Where Assessed - Lower Body Bathing: Unsupported sitting;Supported sit to stand Upper Body Dressing: Minimal assistance Where Assessed - Upper Body Dressing: Unsupported sitting Lower Body Dressing: Maximal assistance Where Assessed - Lower Body Dressing: Unsupported sitting;Supported sit to stand Toilet Transfer: Min Pension scheme manager Method: Sit to Barista: Comfort height toilet;Grab bars Toileting - Architect  and Hygiene: Supervision/safety Where Assessed - Engineer, mining and Hygiene: Sit on 3-in-1 or toilet Equipment Used: Back brace;Gait belt;Rolling walker Transfers/Ambulation Related to ADLs: min guard assist with walker, min assist for sit to stand, verbal cues for hand placement ADL Comments: Pt was able to cross her foot over the opposite knee prior to admission, but now limited by pain.     OT Diagnosis: Generalized weakness;Acute pain  OT Problem List: Decreased strength;Decreased activity tolerance;Impaired balance (sitting and/or standing);Decreased knowledge of use of DME or AE;Decreased knowledge of precautions;Pain OT Treatment Interventions: Self-care/ADL training;DME and/or AE instruction;Patient/family education;Therapeutic activities   OT Goals Acute Rehab OT Goals OT Goal Formulation: With patient Time For Goal Achievement: 03/17/12 Potential to Achieve Goals: Good ADL Goals Pt Will Perform Lower Body Bathing: with supervision;Sitting, edge of bed;Sit to stand from bed;with adaptive equipment;Other (comment) (AE as needed) ADL Goal: Lower Body Bathing - Progress: Goal set today Pt Will Perform Upper Body Dressing: with modified independence;Sitting, bed;Other (comment) (back brace) ADL Goal: Upper Body Dressing - Progress: Goal set today Pt Will Perform Lower Body Dressing: with supervision;Sit to stand from bed;with adaptive equipment;Other (comment) (AE as needed) ADL Goal: Lower Body Dressing - Progress: Goal set today Pt Will Transfer to Toilet: with supervision;3-in-1;Maintaining back safety precautions;Other (comment);Ambulation;with DME (over toilet) ADL Goal: Toilet Transfer - Progress: Goal set today Pt Will Perform Tub/Shower Transfer: Tub transfer;Ambulation;with DME;Maintaining back safety precautions;Other (comment) (determine DME needs) ADL Goal: Tub/Shower Transfer - Progress: Goal set today Miscellaneous OT Goals Miscellaneous OT Goal #1: Pt  will adhere to back precautions during ADL and mobility independently.  Visit Information  Last OT Received On: 03/10/12 Assistance Needed: +1    Subjective Data  Subjective: "I could do better if they pain wasn't so bad." Patient Stated Goal: Return to independence.  Prior Functioning     Home Living Lives With: Significant other Available Help at Discharge: Family;Available 24 hours/day Type of Home: House Home Access: Stairs to enter [employer] of Steps: 1 Entrance Stairs-Rails: None Home Layout: One level Bathroom Shower/Tub: Forensic scientist: Standard Bathroom Accessibility: Yes How Accessible: Accessible via walker Home Adaptive Equipment: Walker - rolling Prior Function Level of Independence: Independent Able to Take Stairs?: Yes Driving: Yes Vocation: Retired Comments: has assistance with housework Communication Communication: No difficulties Dominant Hand: Right         Vision/Perception     Cognition  Overall Cognitive Status: Appears within functional limits for tasks assessed/performed Arousal/Alertness: Awake/alert Orientation Level: Appears intact for tasks assessed Behavior During Session: [facility] for tasks performed    Extremity/Trunk Assessment Right Upper Extremity Assessment RUE ROM/Strength/Tone: [facility] for tasks assessed Left Upper Extremity Assessment LUE ROM/Strength/Tone: WFL for tasks assessed LUE Coordination: WFL - gross/fine motor Right Lower Extremity Assessment     Mobility Bed Mobility Sit to Sidelying Left: 4: Min assist;HOB flat Details for Bed Mobility Assistance: VC for safe technique to maintain back precautions while performing log roll. Min assist into sitting through pelvis Transfers Transfers: Sit to Stand;Stand to Sit Sit to Stand: 4: Min assist;With upper extremity assist;From chair/3-in-1;From toilet Stand to Sit: 4: Min guard;With upper extremity assist;To bed;To toilet Details  for Transfer Assistance: Min assist for stability and support into standing. Cues for safe technique and proper hand placement     Shoulder Instructions     Exercise     Balance     End of Session OT - End of Session Activity Tolerance: Patient limited by pain Patient left: in bed;with call bell/phone within reach Nurse Communication: Other (comment) (IV bleeding, drain lost suction)  GO     Evern Bio 03/10/2012, 9:30 AM [phone]

## 2012-03-11 MED ORDER — OXYCODONE-ACETAMINOPHEN 5-325 MG PO TABS: 1.0000 | ORAL_TABLET | Freq: Four times a day (QID) | ORAL | Status: AC | PRN

## 2012-03-11 NOTE — Progress Notes (Signed)
Physical Therapy Treatment Patient Details Name: Mackenzie Butler MRN: [id number] DOB: 06-18-1955 Today's Date: 03/11/2012 Time: 4098-1191 PT Time Calculation (min): 23 min  PT Assessment / Plan / Recommendation Comments on Treatment Session  Pt progressing with PT goals + mobility at this date.  Increased ambulation distance.   Pt reports pain has been fairly consistent since surgery, rates it as a 8/10 back & R LE however, moving well.        Follow Up Recommendations  No PT follow up;Supervision for mobility/OOB     Does the patient have the potential to tolerate intense rehabilitation     Barriers to Discharge        Equipment Recommendations  None recommended by PT    Recommendations for Other Services    Frequency Min 5X/week   Plan Discharge plan remains appropriate    Precautions / Restrictions Precautions Precautions: Back Precaution Comments: Pt recalled all 3 back precautions (I)'ly Required Braces or Orthoses: Spinal Brace Spinal Brace: Lumbar corset;Applied in sitting position Restrictions Weight Bearing Restrictions: No   Pertinent Vitals/Pain 8/10 back & R LE.  Premedicated.      Mobility  Bed Mobility Bed Mobility: Rolling Left;Left Sidelying to Sit;Sitting - Scoot to Edge of Bed Rolling Left: 5: Supervision;With rail Left Sidelying to Sit: 5: Supervision;HOB flat;With rails Sitting - Scoot to Edge of Bed: 5: Supervision Details for Bed Mobility Assistance: Cues for sequencing & reinforcement of back precautions Transfers Transfers: Sit to Stand;Stand to Sit Sit to Stand: 4: Min assist;4: Min guard;With upper extremity assist;From bed;From toilet Stand to Sit: 4: Min guard;With upper extremity assist;With armrests;To toilet;To chair/3-in-1 Details for Transfer Assistance: Minor (A) to achieve standing from bed but Min Guard from toilet with use of grab bar.  Cues for hand placement & technique.   Ambulation/Gait Ambulation/Gait Assistance: 5:  Supervision Ambulation Distance (Feet): 200 Feet Assistive device: Rolling walker Ambulation/Gait Assistance Details: (S) for safety due to pain.  Cues for safe hand placement on RW Gait Pattern: Step-through pattern;Decreased stride length;Antalgic Gait velocity: slow gait speed Stairs: No Wheelchair Mobility Wheelchair Mobility: No      PT Goals Acute Rehab PT Goals Time For Goal Achievement: 03/17/12 Potential to Achieve Goals: Good Pt will go Supine/Side to Sit: with modified independence PT Goal: Supine/Side to Sit - Progress: Progressing toward goal Pt will go Sit to Supine/Side: with modified independence Pt will go Sit to Stand: with modified independence PT Goal: Sit to Stand - Progress: Progressing toward goal Pt will go Stand to Sit: with modified independence PT Goal: Stand to Sit - Progress: Progressing toward goal Pt will Transfer Bed to Chair/Chair to Bed: with modified independence Pt will Ambulate: >150 feet;with modified independence;with least restrictive assistive device PT Goal: Ambulate - Progress: Progressing toward goal Pt will Go Up / Down Stairs: 1-2 stairs;with supervision  Visit Information  Last PT Received On: 03/11/12 Assistance Needed: +1    Subjective Data      Cognition  Overall Cognitive Status: Appears within functional limits for tasks assessed/performed Arousal/Alertness: Awake/alert Orientation Level: Appears intact for tasks assessed Behavior During Session: [facility] for tasks performed    Balance     End of Session PT - End of Session Equipment Utilized During Treatment: Gait belt;Back brace Activity Tolerance: Patient tolerated treatment well Patient left: in chair;with call bell/phone within reach;with family/visitor present Nurse Communication: Mobility status    Verdell Face, [place] [phone] 03/11/2012

## 2012-03-11 NOTE — Discharge Summary (Signed)
Physician Discharge Summary  Patient ID: Mackenzie Butler MRN: [id number] DOB/AGE: 06/09/1955 56 y.o.  Admit date: 03/09/2012 Discharge date: 03/11/2012  Admission Diagnoses:Fusion failure  Discharge Diagnoses: psuedoarthrosis Active Problems:  * No active hospital problems. 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 Post op she has done well. She is ambulating, voiding and tolerating a regular diet. Wound is clean dry and without signs of infection.   Consults: None  Significant Diagnostic Studies: none  Treatments: surgery: Removal of hardware L 2 L3 L4-L5 followed by segmental pedicle screw instrumentation T11 and T12-L1 L4-L5 with [employer] pedicle screw system followed by T11-L5 posterolateral fusion   Discharge Exam: Blood pressure 98/62, pulse 75, temperature 98 F (36.7 C), temperature source Oral, resp. rate 16, height 5\' 1"  (1.549 m), weight 58.968 kg (130 lb), SpO2 96.00%. General appearance: alert, cooperative, appears stated age and mild distress Neurologic: Alert and oriented X 3, normal strength and tone. Normal symmetric reflexes. Normal coordination and gait  Disposition:      Medication List     As of 03/11/2012 10:23 AM    TAKE these medications         albuterol 108 (90 BASE) MCG/ACT inhaler   Commonly known as: PROVENTIL HFA;VENTOLIN HFA   Inhale 2 puffs into the lungs every 6 (six) hours as needed. For shortness of breath      ALPRAZolam 1 MG tablet   Commonly known as: XANAX   Take 1 mg by mouth 3 (three) times daily as needed. For anxiety      aspirin 81 MG tablet   Take 81 mg by mouth daily.      budesonide-formoterol 160-4.5 MCG/ACT inhaler   Commonly known as: SYMBICORT   Inhale 2 puffs into the lungs 2 (two) times daily.      bumetanide 1 MG  tablet   Commonly known as: BUMEX   Take 1 mg by mouth 2 (two) times daily.      butalbital-acetaminophen-caffeine 50-325-40 MG per tablet   Commonly known as: FIORICET, ESGIC   Take 1 tablet by mouth 2 (two) times daily as needed. For migraines      carisoprodol 350 MG tablet   Commonly known as: SOMA   Take 350 mg by mouth 4 (four) times daily as needed. For muscle pain      DULoxetine 60 MG capsule   Commonly known as: CYMBALTA   Take 60 mg by mouth daily.      esomeprazole 40 MG capsule   Commonly known as: NEXIUM   Take 40 mg by mouth daily before breakfast.      estradiol 2 MG tablet   Commonly known as: ESTRACE   Take 2 mg by mouth daily.      fish oil-omega-3 fatty acids 1000 MG capsule   Take 4 g by mouth 2 (two) times daily.      fluticasone 50 MCG/ACT nasal spray   Commonly known as: FLONASE   Place 2 sprays into the nose daily.      folic acid 1 MG tablet   Commonly known as: FOLVITE   Take 1 mg by mouth 2 (two) times daily.      magnesium oxide 400 MG tablet   Commonly known as: MAG-OX   Take 400 mg by mouth 2 (two) times  daily.      nadolol 40 MG tablet   Commonly known as: CORGARD   Take 40 mg by mouth daily.      oxycodone 30 MG immediate release tablet   Commonly known as: ROXICODONE   Take 30 mg by mouth every 6 (six) hours as needed. For pain      oxyCODONE-acetaminophen 5-325 MG per tablet   Commonly known as: PERCOCET/ROXICET   Take 1 tablet by mouth every 6 (six) hours as needed for pain.      potassium chloride SA 20 MEQ tablet   Commonly known as: K-DUR,KLOR-CON   Take 20 mEq by mouth 2 (two) times daily.      pramipexole 0.25 MG tablet   Commonly known as: MIRAPEX   Take 0.5 mg by mouth at bedtime.      pregabalin 100 MG capsule   Commonly known as: LYRICA   Take 100 mg by mouth 3 (three) times daily as needed. For pain      promethazine 25 MG tablet   Commonly known as: PHENERGAN   Take 25 mg by mouth every 6 (six) hours as  needed.      simvastatin 40 MG tablet   Commonly known as: ZOCOR   Take 40 mg by mouth every evening.      Vitamin D (Ergocalciferol) 50000 UNITS Caps   Commonly known as: DRISDOL   Take 50,000 Units by mouth every 7 (seven) days. Take on saturday      zolpidem 10 MG tablet   Commonly known as: AMBIEN   Take 10 mg by mouth at bedtime as needed. For sleep           Follow-up Information    Follow up with Reinaldo Meeker, MD. In 2 weeks. (call to make appt )    Contact information:   [address] [place] [address] [phone]          Signed: Daivd Fredericksen L 03/11/2012, 10:23 AM

## 2012-03-11 NOTE — Progress Notes (Signed)
Patient education and discharge instructions given to patient and family. All questions answered to patient's satisfaction. OT recommended 3N1, however patient said she did not need that at home. Pt D/C home with no signs of acute distress.

## 2012-03-13 ENCOUNTER — Encounter (HOSPITAL_COMMUNITY): Payer: Self-pay | Admitting: Neurosurgery

## 2013-09-04 DIAGNOSIS — G5602 Carpal tunnel syndrome, left upper limb: Secondary | ICD-10-CM | POA: Insufficient documentation

## 2013-11-13 DIAGNOSIS — Z9889 Other specified postprocedural states: Secondary | ICD-10-CM | POA: Insufficient documentation

## 2014-08-15 DIAGNOSIS — H2512 Age-related nuclear cataract, left eye: Secondary | ICD-10-CM | POA: Insufficient documentation

## 2014-08-29 DIAGNOSIS — H2511 Age-related nuclear cataract, right eye: Secondary | ICD-10-CM | POA: Insufficient documentation

## 2016-08-03 DIAGNOSIS — R600 Localized edema: Secondary | ICD-10-CM | POA: Insufficient documentation

## 2016-08-03 DIAGNOSIS — I6529 Occlusion and stenosis of unspecified carotid artery: Secondary | ICD-10-CM | POA: Insufficient documentation

## 2016-08-03 DIAGNOSIS — M797 Fibromyalgia: Secondary | ICD-10-CM | POA: Insufficient documentation

## 2016-08-03 DIAGNOSIS — K219 Gastro-esophageal reflux disease without esophagitis: Secondary | ICD-10-CM | POA: Insufficient documentation

## 2016-08-03 DIAGNOSIS — E876 Hypokalemia: Secondary | ICD-10-CM | POA: Insufficient documentation

## 2016-08-03 DIAGNOSIS — J449 Chronic obstructive pulmonary disease, unspecified: Secondary | ICD-10-CM | POA: Insufficient documentation

## 2016-08-03 DIAGNOSIS — F17200 Nicotine dependence, unspecified, uncomplicated: Secondary | ICD-10-CM | POA: Insufficient documentation

## 2016-08-03 DIAGNOSIS — N183 Chronic kidney disease, stage 3 unspecified: Secondary | ICD-10-CM | POA: Insufficient documentation

## 2016-10-21 ENCOUNTER — Other Ambulatory Visit: Payer: Self-pay | Admitting: Neurosurgery

## 2016-10-21 DIAGNOSIS — G8929 Other chronic pain: Secondary | ICD-10-CM

## 2016-10-21 DIAGNOSIS — M5441 Lumbago with sciatica, right side: Principal | ICD-10-CM

## 2016-11-02 ENCOUNTER — Ambulatory Visit
Admission: RE | Admit: 2016-11-02 | Discharge: 2016-11-02 | Disposition: A | Discharge status: Home / Self Care | Payer: Medicare HMO | Source: Ambulatory Visit | Attending: Neurosurgery | Admitting: Neurosurgery

## 2016-11-02 DIAGNOSIS — G8929 Other chronic pain: Secondary | ICD-10-CM

## 2016-11-02 DIAGNOSIS — E785 Hyperlipidemia, unspecified: Secondary | ICD-10-CM | POA: Insufficient documentation

## 2016-11-02 DIAGNOSIS — M5441 Lumbago with sciatica, right side: Principal | ICD-10-CM

## 2016-11-02 MED ORDER — ONDANSETRON HCL 4 MG/2ML IJ SOLN
4.0000 mg | Freq: Once | INTRAMUSCULAR | Status: AC
  Administered 2016-11-02: 4 mg via INTRAMUSCULAR

## 2016-11-02 MED ORDER — MEPERIDINE HCL 100 MG/ML IJ SOLN
50.0000 mg | Freq: Once | INTRAMUSCULAR | Status: AC
  Administered 2016-11-02: 25 mg via INTRAMUSCULAR

## 2016-11-02 MED ORDER — IOPAMIDOL (ISOVUE-M 200) INJECTION 41%
15.0000 mL | Freq: Once | INTRAMUSCULAR | Status: AC
  Administered 2016-11-02: 15 mL via INTRATHECAL

## 2016-11-02 MED ORDER — DIAZEPAM 5 MG PO TABS
10.0000 mg | ORAL_TABLET | Freq: Once | ORAL | Status: AC
  Administered 2016-11-02: 5 mg via ORAL

## 2016-11-02 NOTE — Discharge Instructions (Signed)
Myelogram Discharge Instructions  1. Go home and rest quietly for the next 24 hours.  It is important to lie flat for the next 24 hours.  Get up only to go to the restroom.  You may lie in the bed or on a couch on your back, your stomach, your left side or your right side.  You may have one pillow under your head.  You may have pillows between your knees while you are on your side or under your knees while you are on your back.  2. DO NOT drive today.  Recline the seat as far back as it will go, while still wearing your seat belt, on the way home.  3. You may get up to go to the bathroom as needed.  You may sit up for 10 minutes to eat.  You may resume your normal diet and medications unless otherwise indicated.  Drink lots of extra fluids today and tomorrow.  4. The incidence of headache, nausea, or vomiting is about 5% (one in 20 patients).  If you develop a headache, lie flat and drink plenty of fluids until the headache goes away.  Caffeinated beverages may be helpful.  If you develop severe nausea and vomiting or a headache that does not go away with flat bed rest, call [phone].  5. You may resume normal activities after your 24 hours of bed rest is over; however, do not exert yourself strongly or do any heavy lifting tomorrow. If when you get up you have a headache when standing, go back to bed and force fluids for another 24 hours.  6. Call your physician for a follow-up appointment.  The results of your myelogram will be sent directly to your physician by the following day.  7. If you have any questions or if complications develop after you arrive home, please call [phone].  Discharge instructions have been explained to the patient.  The patient, or the person responsible for the patient, fully understands these instructions.       May resume Cymbalta on Aug. 15, 2018, after 9:30 am.

## 2016-11-02 NOTE — Progress Notes (Signed)
Patient states she has been off Cymbalta for the past two weeks.  Donell SievertJeanne Aubra Pappalardo, RN

## 2016-11-02 NOTE — Progress Notes (Signed)
Pt states she has been of Cymbalta for the last 2 days.

## 2017-08-22 MED ORDER — SODIUM BICARBONATE 650 MG PO TABS
1300.00 | ORAL_TABLET | ORAL | Status: DC
Start: 2017-08-22 — End: 2017-08-22

## 2017-08-22 MED ORDER — FLUTICASONE FUROATE-VILANTEROL 200-25 MCG/INH IN AEPB
1.00 | INHALATION_SPRAY | RESPIRATORY_TRACT | Status: DC
Start: 2017-08-23 — End: 2017-08-22

## 2017-08-22 MED ORDER — DOCUSATE SODIUM 100 MG PO CAPS
100.00 | ORAL_CAPSULE | ORAL | Status: DC
Start: ? — End: 2017-08-22

## 2017-08-22 MED ORDER — PRAMIPEXOLE DIHYDROCHLORIDE 0.5 MG PO TABS
4.50 | ORAL_TABLET | ORAL | Status: DC
Start: 2017-08-22 — End: 2017-08-22

## 2017-08-22 MED ORDER — TOPIRAMATE 100 MG PO TABS
200.00 | ORAL_TABLET | ORAL | Status: DC
Start: 2017-08-22 — End: 2017-08-22

## 2017-08-22 MED ORDER — CHOLESTYRAMINE 4 G PO PACK
4.00 g | PACK | ORAL | Status: DC
Start: 2017-08-22 — End: 2017-08-22

## 2017-08-22 MED ORDER — POTASSIUM CHLORIDE CRYS ER 20 MEQ PO TBCR
EXTENDED_RELEASE_TABLET | ORAL | Status: DC
Start: 2017-08-23 — End: 2017-08-22

## 2017-08-22 MED ORDER — OXYCODONE-ACETAMINOPHEN 5-325 MG PO TABS
1.00 | ORAL_TABLET | ORAL | Status: DC
Start: ? — End: 2017-08-22

## 2017-08-22 MED ORDER — ACETAMINOPHEN 650 MG/20.3ML PO SOLN
650.00 | ORAL | Status: DC
Start: ? — End: 2017-08-22

## 2017-08-22 MED ORDER — RANITIDINE HCL 150 MG PO TABS
150.00 | ORAL_TABLET | ORAL | Status: DC
Start: 2017-08-22 — End: 2017-08-22

## 2017-08-22 MED ORDER — FOLIC ACID 1 MG PO TABS
1.00 | ORAL_TABLET | ORAL | Status: DC
Start: 2017-08-23 — End: 2017-08-22

## 2017-08-22 MED ORDER — FERROUS SULFATE 325 (65 FE) MG PO TABS
325.00 | ORAL_TABLET | ORAL | Status: DC
Start: 2017-08-23 — End: 2017-08-22

## 2017-08-22 MED ORDER — BISACODYL 5 MG PO TBEC
10.00 | DELAYED_RELEASE_TABLET | ORAL | Status: DC
Start: ? — End: 2017-08-22

## 2017-08-22 MED ORDER — MAGNESIUM OXIDE 400 MG PO TABS
400.00 | ORAL_TABLET | ORAL | Status: DC
Start: 2017-08-23 — End: 2017-08-22

## 2017-08-22 MED ORDER — CYCLOBENZAPRINE HCL 10 MG PO TABS
5.00 | ORAL_TABLET | ORAL | Status: DC
Start: 2017-08-22 — End: 2017-08-22

## 2017-08-22 MED ORDER — FLUTICASONE PROPIONATE 50 MCG/ACT NA SUSP
1.00 | NASAL | Status: DC
Start: 2017-08-23 — End: 2017-08-22

## 2017-08-22 MED ORDER — ESTRADIOL 1 MG PO TABS
2.00 | ORAL_TABLET | ORAL | Status: DC
Start: 2017-08-23 — End: 2017-08-22

## 2017-08-22 MED ORDER — GENERIC EXTERNAL MEDICATION
2.00 g | Status: DC
Start: 2017-08-22 — End: 2017-08-22

## 2017-08-22 MED ORDER — GENERIC EXTERNAL MEDICATION
300.00 | Status: DC
Start: 2017-08-23 — End: 2017-08-22

## 2017-08-22 MED ORDER — GUAIFENESIN ER 600 MG PO TB12
1200.00 | ORAL_TABLET | ORAL | Status: DC
Start: 2017-08-22 — End: 2017-08-22

## 2017-08-22 MED ORDER — ALBUTEROL SULFATE (2.5 MG/3ML) 0.083% IN NEBU
2.50 | INHALATION_SOLUTION | RESPIRATORY_TRACT | Status: DC
Start: ? — End: 2017-08-22

## 2017-08-22 MED ORDER — IPRATROPIUM BROMIDE 0.02 % IN SOLN
0.50 | RESPIRATORY_TRACT | Status: DC
Start: ? — End: 2017-08-22

## 2017-08-22 MED ORDER — CALCIUM CARBONATE 1250 (500 CA) MG PO TABS
1250.00 | ORAL_TABLET | ORAL | Status: DC
Start: 2017-08-23 — End: 2017-08-22

## 2017-08-22 MED ORDER — RIFAMPIN 300 MG PO CAPS
300.00 | ORAL_CAPSULE | ORAL | Status: DC
Start: 2017-08-22 — End: 2017-08-22

## 2017-08-22 MED ORDER — POT & SOD CIT-CIT AC 550-500-334 MG/5ML PO SOLN
30.00 | ORAL | Status: DC
Start: 2017-08-22 — End: 2017-08-22

## 2017-08-22 MED ORDER — ONDANSETRON 4 MG PO TBDP
4.00 | ORAL_TABLET | ORAL | Status: DC
Start: ? — End: 2017-08-22

## 2017-08-22 MED ORDER — ATORVASTATIN CALCIUM 10 MG PO TABS
10.00 | ORAL_TABLET | ORAL | Status: DC
Start: 2017-08-22 — End: 2017-08-22

## 2017-08-22 MED ORDER — CHOLECALCIFEROL 25 MCG (1000 UT) PO TABS
1000.00 | ORAL_TABLET | ORAL | Status: DC
Start: 2017-08-22 — End: 2017-08-22

## 2017-08-22 MED ORDER — ONDANSETRON HCL 4 MG/2ML IJ SOLN
4.00 | INTRAMUSCULAR | Status: DC
Start: ? — End: 2017-08-22

## 2017-08-22 MED ORDER — ALBUTEROL SULFATE (5 MG/ML) 0.5% IN NEBU
2.50 | INHALATION_SOLUTION | RESPIRATORY_TRACT | Status: DC
Start: ? — End: 2017-08-22

## 2017-08-22 MED ORDER — ALUMINUM-MAGNESIUM-SIMETHICONE 200-200-20 MG/5ML PO SUSP
30.00 | ORAL | Status: DC
Start: ? — End: 2017-08-22
